# Patient Record
Sex: Male | Born: 1970 | Race: Black or African American | Hispanic: No | Marital: Single | Smoking: Former smoker
Health system: Southern US, Community
[De-identification: ages and names within clinical notes are randomized; demographics above are authoritative.]

## PROBLEM LIST (undated history)

## (undated) DIAGNOSIS — D560 Alpha thalassemia: Secondary | ICD-10-CM

## (undated) DIAGNOSIS — K069 Disorder of gingiva and edentulous alveolar ridge, unspecified: Secondary | ICD-10-CM

## (undated) DIAGNOSIS — E269 Hyperaldosteronism, unspecified: Secondary | ICD-10-CM

## (undated) DIAGNOSIS — E279 Disorder of adrenal gland, unspecified: Secondary | ICD-10-CM

## (undated) DIAGNOSIS — G4730 Sleep apnea, unspecified: Secondary | ICD-10-CM

## (undated) DIAGNOSIS — I10 Essential (primary) hypertension: Secondary | ICD-10-CM

## (undated) HISTORY — DX: Disorder of gingiva and edentulous alveolar ridge, unspecified: K06.9

## (undated) HISTORY — DX: Alpha thalassemia: D56.0

## (undated) HISTORY — DX: Essential (primary) hypertension: I10

---

## 2008-03-22 DIAGNOSIS — D560 Alpha thalassemia: Secondary | ICD-10-CM

## 2008-03-22 HISTORY — DX: Alpha thalassemia: D56.0

## 2011-06-23 ENCOUNTER — Encounter (INDEPENDENT_AMBULATORY_CARE_PROVIDER_SITE_OTHER): Payer: Self-pay | Admitting: Surgery

## 2011-06-23 ENCOUNTER — Ambulatory Visit (INDEPENDENT_AMBULATORY_CARE_PROVIDER_SITE_OTHER): Payer: BC Managed Care – PPO | Admitting: Surgery

## 2011-06-23 VITALS — BP 134/82 | HR 74 | Temp 97.8°F | Resp 18 | Ht 69.0 in | Wt >= 6400 oz

## 2011-06-23 DIAGNOSIS — E669 Obesity, unspecified: Secondary | ICD-10-CM | POA: Insufficient documentation

## 2011-06-23 DIAGNOSIS — Z6841 Body Mass Index (BMI) 40.0 and over, adult: Secondary | ICD-10-CM

## 2011-06-23 LAB — COMPREHENSIVE METABOLIC PANEL
Albumin: 4 g/dL (ref 3.5–5.2)
Alkaline Phosphatase: 65 U/L (ref 39–117)
BUN: 16 mg/dL (ref 6–23)
CO2: 25 mEq/L (ref 19–32)
Glucose, Bld: 88 mg/dL (ref 70–99)
Potassium: 4.6 mEq/L (ref 3.5–5.3)
Total Bilirubin: 0.4 mg/dL (ref 0.3–1.2)
Total Protein: 7.4 g/dL (ref 6.0–8.3)

## 2011-06-23 LAB — LIPID PANEL
Cholesterol: 123 mg/dL (ref 0–200)
Total CHOL/HDL Ratio: 3 Ratio

## 2011-06-23 LAB — T4: T4, Total: 8 ug/dL (ref 5.0–12.5)

## 2011-06-23 LAB — TSH: TSH: 2.994 u[IU]/mL (ref 0.350–4.500)

## 2011-06-23 NOTE — Progress Notes (Signed)
Addended by: Lenise Herald on: 06/23/2011 11:49 AM   Modules accepted: Orders

## 2011-06-23 NOTE — Progress Notes (Signed)
Chief Complaint:  Morbid obesity BMI 61  History of Present Illness:  Kenneth Barrett is an 41 y.o. male who works for [employer] and who is followed at [facility]  which has some problems in that he has to change doctors frequently. He has been one of our seminars and is interested in a bypass or band. I told him I would like for him to lose down to a BMI in the mid 50s before we consider a bypass. He is a big man and what I explained to him why his surgery would be more difficult. Today's weight is 4:15. He will like to go ahead and proceed with a workup and I'm going to begin that for him.  Past Medical History  Diagnosis Date  . Hypertension   . Gum disease     No past surgical history on file.  Current Outpatient Prescriptions  Medication Sig Dispense Refill  . amLODipine (NORVASC) 10 MG tablet Daily.      Marland Kitchen atenolol (TENORMIN) 50 MG tablet Daily.      . enalapril (VASOTEC) 20 MG tablet Twice daily.      . hydrochlorothiazide (HYDRODIURIL) 25 MG tablet Daily.      Marland Kitchen KLOR-CON 10 10 MEQ tablet Twice daily.      Marland Kitchen spironolactone (ALDACTONE) 25 MG tablet Twice daily.       Review of patient's allergies indicates no known allergies. Family History  Problem Relation Age of Onset  . Hypertension Mother   . Stroke Mother    Social History:   reports that he quit smoking about 10 months ago. He has never used smokeless tobacco. He reports that he drinks alcohol. He reports that he does not use illicit drugs.   REVIEW OF SYSTEMS - PERTINENT POSITIVES ONLY: negative  Physical Exam:   Blood pressure 134/82, pulse 74, temperature 97.8 F (36.6 C), temperature source Temporal, resp. rate 18, height 5\' 9"  (1.753 m), weight 415 lb (188.243 kg). Body mass index is 61.28 kg/(m^2).  Gen:  WDWN AA male NAD  Neurological: Alert and oriented to person, place, and time. Motor and sensory function is grossly intact  Head: Normocephalic and atraumatic.  Eyes: Conjunctivae are normal.  Pupils are equal, round, and reactive to light. No scleral icterus.  Neck: Normal range of motion. Neck supple. No tracheal deviation or thyromegaly present.  Cardiovascular:  SR without murmurs or gallops.  No carotid bruits Respiratory: Effort normal.  No respiratory distress. No chest wall tenderness. Breath sounds normal.  No wheezes, rales or rhonchi.  Abdomen:  Obese. No prior surgery GU: Musculoskeletal: Normal range of motion. Extremities are nontender. No cyanosis, edema or clubbing noted Lymphadenopathy: No cervical, preauricular, postauricular or axillary adenopathy is present Skin: Skin is warm and dry. No rash noted. No diaphoresis. No erythema. No pallor. Pscyh: Normal mood and affect. Behavior is normal. Judgment and thought content normal.   LABORATORY RESULTS: No results found for this or any previous visit (from the past 48 hour(s)).  RADIOLOGY RESULTS: No results found.  Problem List: There is no problem list on file for this patient.   Assessment & Plan: Morbid obesity.  Begin workup toward lapband or bypass.      Matt B. Daphine Deutscher, MD, [facility] [phone] beeper [phone]  06/23/2011 11:05 AM

## 2011-06-24 LAB — CBC
HCT: 34.2 % — ABNORMAL LOW (ref 39.0–52.0)
Hemoglobin: 10.1 g/dL — ABNORMAL LOW (ref 13.0–17.0)
MCH: 21.3 pg — ABNORMAL LOW (ref 26.0–34.0)
MCHC: 29.5 g/dL — ABNORMAL LOW (ref 30.0–36.0)
RBC: 4.74 MIL/uL (ref 4.22–5.81)

## 2011-06-29 ENCOUNTER — Ambulatory Visit (HOSPITAL_COMMUNITY)
Admission: RE | Admit: 2011-06-29 | Discharge: 2011-06-29 | Disposition: A | Discharge status: Home / Self Care | Payer: BC Managed Care – PPO | Source: Ambulatory Visit | Attending: Surgery | Admitting: Surgery

## 2011-06-29 DIAGNOSIS — K8020 Calculus of gallbladder without cholecystitis without obstruction: Secondary | ICD-10-CM | POA: Insufficient documentation

## 2011-06-29 DIAGNOSIS — E669 Obesity, unspecified: Secondary | ICD-10-CM

## 2011-07-24 ENCOUNTER — Ambulatory Visit: Payer: Self-pay | Admitting: *Deleted

## 2011-08-02 ENCOUNTER — Ambulatory Visit (HOSPITAL_COMMUNITY): Admission: RE | Admit: 2011-08-02 | Payer: BC Managed Care – PPO | Source: Ambulatory Visit | Admitting: Surgery

## 2011-08-02 ENCOUNTER — Encounter (HOSPITAL_COMMUNITY): Admission: RE | Payer: Self-pay | Source: Ambulatory Visit

## 2011-08-02 SURGERY — BREATH TEST, FOR HELICOBACTER PYLORI

## 2011-08-24 ENCOUNTER — Ambulatory Visit: Payer: Self-pay | Admitting: *Deleted

## 2011-08-27 ENCOUNTER — Encounter: Payer: Self-pay | Admitting: *Deleted

## 2011-09-07 ENCOUNTER — Ambulatory Visit (HOSPITAL_COMMUNITY): Admission: RE | Admit: 2011-09-07 | Payer: BC Managed Care – PPO | Source: Ambulatory Visit | Admitting: Surgery

## 2011-09-07 ENCOUNTER — Encounter (HOSPITAL_COMMUNITY): Admission: RE | Payer: Self-pay | Source: Ambulatory Visit

## 2011-09-07 SURGERY — BREATH TEST, FOR HELICOBACTER PYLORI

## 2011-09-21 ENCOUNTER — Ambulatory Visit (HOSPITAL_COMMUNITY): Admission: RE | Admit: 2011-09-21 | Payer: BC Managed Care – PPO | Source: Ambulatory Visit | Admitting: Surgery

## 2011-09-21 SURGERY — BREATH TEST, FOR HELICOBACTER PYLORI

## 2012-04-21 ENCOUNTER — Encounter (HOSPITAL_COMMUNITY): Payer: Self-pay | Admitting: *Deleted

## 2012-04-21 ENCOUNTER — Inpatient Hospital Stay (HOSPITAL_COMMUNITY): Payer: BC Managed Care – PPO

## 2012-04-21 ENCOUNTER — Emergency Department (HOSPITAL_COMMUNITY): Payer: BC Managed Care – PPO

## 2012-04-21 ENCOUNTER — Inpatient Hospital Stay (HOSPITAL_COMMUNITY)
Admission: EM | Admit: 2012-04-21 | Discharge: 2012-04-26 | DRG: 568 | Disposition: A | Discharge status: Home / Self Care | Payer: BC Managed Care – PPO | Attending: Emergency Medicine | Admitting: Emergency Medicine

## 2012-04-21 DIAGNOSIS — R197 Diarrhea, unspecified: Secondary | ICD-10-CM

## 2012-04-21 DIAGNOSIS — I10 Essential (primary) hypertension: Secondary | ICD-10-CM | POA: Diagnosis present

## 2012-04-21 DIAGNOSIS — J111 Influenza due to unidentified influenza virus with other respiratory manifestations: Secondary | ICD-10-CM | POA: Diagnosis present

## 2012-04-21 DIAGNOSIS — Z79899 Other long term (current) drug therapy: Secondary | ICD-10-CM

## 2012-04-21 DIAGNOSIS — E669 Obesity, unspecified: Secondary | ICD-10-CM

## 2012-04-21 DIAGNOSIS — E875 Hyperkalemia: Secondary | ICD-10-CM | POA: Diagnosis present

## 2012-04-21 DIAGNOSIS — D649 Anemia, unspecified: Secondary | ICD-10-CM | POA: Diagnosis present

## 2012-04-21 DIAGNOSIS — R579 Shock, unspecified: Secondary | ICD-10-CM | POA: Diagnosis present

## 2012-04-21 DIAGNOSIS — T465X5A Adverse effect of other antihypertensive drugs, initial encounter: Secondary | ICD-10-CM | POA: Diagnosis present

## 2012-04-21 DIAGNOSIS — E872 Acidosis, unspecified: Secondary | ICD-10-CM | POA: Diagnosis present

## 2012-04-21 DIAGNOSIS — G4733 Obstructive sleep apnea (adult) (pediatric): Secondary | ICD-10-CM | POA: Diagnosis present

## 2012-04-21 DIAGNOSIS — R578 Other shock: Secondary | ICD-10-CM | POA: Diagnosis present

## 2012-04-21 DIAGNOSIS — R112 Nausea with vomiting, unspecified: Secondary | ICD-10-CM | POA: Diagnosis present

## 2012-04-21 DIAGNOSIS — E2749 Other adrenocortical insufficiency: Secondary | ICD-10-CM | POA: Diagnosis present

## 2012-04-21 DIAGNOSIS — Z9181 History of falling: Secondary | ICD-10-CM

## 2012-04-21 DIAGNOSIS — N179 Acute kidney failure, unspecified: Principal | ICD-10-CM | POA: Diagnosis present

## 2012-04-21 DIAGNOSIS — Z87891 Personal history of nicotine dependence: Secondary | ICD-10-CM

## 2012-04-21 DIAGNOSIS — A09 Infectious gastroenteritis and colitis, unspecified: Secondary | ICD-10-CM | POA: Diagnosis present

## 2012-04-21 DIAGNOSIS — E869 Volume depletion, unspecified: Secondary | ICD-10-CM

## 2012-04-21 DIAGNOSIS — E871 Hypo-osmolality and hyponatremia: Secondary | ICD-10-CM | POA: Diagnosis present

## 2012-04-21 DIAGNOSIS — T502X5A Adverse effect of carbonic-anhydrase inhibitors, benzothiadiazides and other diuretics, initial encounter: Secondary | ICD-10-CM | POA: Diagnosis present

## 2012-04-21 DIAGNOSIS — Y92009 Unspecified place in unspecified non-institutional (private) residence as the place of occurrence of the external cause: Secondary | ICD-10-CM

## 2012-04-21 DIAGNOSIS — E269 Hyperaldosteronism, unspecified: Secondary | ICD-10-CM | POA: Diagnosis present

## 2012-04-21 HISTORY — DX: Hyperaldosteronism, unspecified: E26.9

## 2012-04-21 HISTORY — DX: Sleep apnea, unspecified: G47.30

## 2012-04-21 HISTORY — DX: Disorder of adrenal gland, unspecified: E27.9

## 2012-04-21 LAB — URINALYSIS, ROUTINE W REFLEX MICROSCOPIC
Bilirubin Urine: NEGATIVE
Glucose, UA: NEGATIVE mg/dL
Leukocytes, UA: NEGATIVE
Nitrite: NEGATIVE
Protein, ur: 100 mg/dL — AB
pH: 5 (ref 5.0–8.0)

## 2012-04-21 LAB — CBC WITH DIFFERENTIAL/PLATELET
Basophils Relative: 0 % (ref 0–1)
Eosinophils Absolute: 0 10*3/uL (ref 0.0–0.7)
MCH: 21.7 pg — ABNORMAL LOW (ref 26.0–34.0)
MCHC: 31.8 g/dL (ref 30.0–36.0)
Monocytes Absolute: 1.2 10*3/uL — ABNORMAL HIGH (ref 0.1–1.0)
Neutrophils Relative %: 73 % (ref 43–77)
Platelets: 268 10*3/uL (ref 150–400)
RDW: 19.7 % — ABNORMAL HIGH (ref 11.5–15.5)

## 2012-04-21 LAB — MAGNESIUM: Magnesium: 2.4 mg/dL (ref 1.5–2.5)

## 2012-04-21 LAB — CORTISOL: Cortisol, Plasma: 19.6 ug/dL

## 2012-04-21 LAB — BASIC METABOLIC PANEL
BUN: 62 mg/dL — ABNORMAL HIGH (ref 6–23)
BUN: 60 mg/dL — ABNORMAL HIGH (ref 6–23)
CO2: 13 mEq/L — ABNORMAL LOW (ref 19–32)
CO2: 12 mEq/L — ABNORMAL LOW (ref 19–32)
Calcium: 8.7 mg/dL (ref 8.4–10.5)
Calcium: 6.9 mg/dL — ABNORMAL LOW (ref 8.4–10.5)
Chloride: 106 mEq/L (ref 96–112)
Chloride: 107 mEq/L (ref 96–112)
Creatinine, Ser: 9.1 mg/dL — ABNORMAL HIGH (ref 0.50–1.35)
Creatinine, Ser: 7.24 mg/dL — ABNORMAL HIGH (ref 0.50–1.35)
GFR calc Af Amer: 10 mL/min — ABNORMAL LOW (ref >90)
GFR calc non Af Amer: 7 mL/min — ABNORMAL LOW (ref >90)
GFR calc non Af Amer: 8 mL/min — ABNORMAL LOW (ref >90)
Glucose, Bld: 86 mg/dL (ref 70–99)
Potassium: 6 mEq/L — ABNORMAL HIGH (ref 3.5–5.1)

## 2012-04-21 LAB — URINE MICROSCOPIC-ADD ON

## 2012-04-21 LAB — BLOOD GAS, ARTERIAL
Acid-base deficit: 14.7 mmol/L — ABNORMAL HIGH (ref 0.0–2.0)
Bicarbonate: 10.9 mEq/L — ABNORMAL LOW (ref 20.0–24.0)
O2 Saturation: 95.2 %
Patient temperature: 98.6
pO2, Arterial: 83.6 mmHg (ref 80.0–100.0)

## 2012-04-21 LAB — GLUCOSE, CAPILLARY

## 2012-04-21 LAB — MRSA PCR SCREENING: MRSA by PCR: POSITIVE — AB

## 2012-04-21 LAB — PHOSPHORUS: Phosphorus: 5.7 mg/dL — ABNORMAL HIGH (ref 2.3–4.6)

## 2012-04-21 LAB — CARBOXYHEMOGLOBIN
Carboxyhemoglobin: 1.2 % (ref 0.5–1.5)
Methemoglobin: 0.7 % (ref 0.0–1.5)

## 2012-04-21 LAB — LACTIC ACID, PLASMA: Lactic Acid, Venous: 0.8 mmol/L (ref 0.5–2.2)

## 2012-04-21 MED ORDER — SODIUM CHLORIDE 0.9 % IV SOLN
1000.0000 mL | Freq: Once | INTRAVENOUS | Status: AC
  Administered 2012-04-21: 1000 mL via INTRAVENOUS

## 2012-04-21 MED ORDER — METRONIDAZOLE IN NACL 5-0.79 MG/ML-% IV SOLN
500.0000 mg | Freq: Three times a day (TID) | INTRAVENOUS | Status: DC
  Administered 2012-04-21 – 2012-04-23 (×6): 500 mg via INTRAVENOUS
  Filled 2012-04-21 (×7): qty 100

## 2012-04-21 MED ORDER — INSULIN ASPART 100 UNIT/ML ~~LOC~~ SOLN
5.0000 [IU] | Freq: Once | SUBCUTANEOUS | Status: AC
  Administered 2012-04-21: 5 [IU] via SUBCUTANEOUS
  Filled 2012-04-21: qty 5

## 2012-04-21 MED ORDER — HEPARIN SODIUM (PORCINE) 5000 UNIT/ML IJ SOLN
5000.0000 [IU] | Freq: Three times a day (TID) | INTRAMUSCULAR | Status: DC
  Administered 2012-04-21 – 2012-04-26 (×14): 5000 [IU] via SUBCUTANEOUS
  Filled 2012-04-21 (×18): qty 1

## 2012-04-21 MED ORDER — SODIUM BICARBONATE 4.2 % IV SOLN
50.0000 meq | Freq: Once | INTRAVENOUS | Status: DC
  Filled 2012-04-21: qty 100

## 2012-04-21 MED ORDER — INSULIN REGULAR HUMAN 100 UNIT/ML IJ SOLN: 10.0000 [IU] | Freq: Once | INTRAMUSCULAR | Status: DC

## 2012-04-21 MED ORDER — SODIUM BICARBONATE 8.4 % IV SOLN: INTRAVENOUS | Status: DC

## 2012-04-21 MED ORDER — MUPIROCIN 2 % EX OINT
1.0000 "application " | TOPICAL_OINTMENT | Freq: Two times a day (BID) | CUTANEOUS | Status: AC
  Administered 2012-04-21 – 2012-04-26 (×10): 1 via NASAL
  Filled 2012-04-21: qty 22

## 2012-04-21 MED ORDER — CHLORHEXIDINE GLUCONATE CLOTH 2 % EX PADS
6.0000 | MEDICATED_PAD | Freq: Every day | CUTANEOUS | Status: DC
  Administered 2012-04-22 – 2012-04-24 (×3): 6 via TOPICAL

## 2012-04-21 MED ORDER — FAMOTIDINE IN NACL 20-0.9 MG/50ML-% IV SOLN
20.0000 mg | Freq: Two times a day (BID) | INTRAVENOUS | Status: DC
  Administered 2012-04-21 – 2012-04-24 (×7): 20 mg via INTRAVENOUS
  Filled 2012-04-21 (×10): qty 50

## 2012-04-21 MED ORDER — SODIUM CHLORIDE 0.9 % IV SOLN
INTRAVENOUS | Status: DC
  Administered 2012-04-21: 400 mL/h via INTRAVENOUS

## 2012-04-21 MED ORDER — INSULIN REGULAR HUMAN 100 UNIT/ML IJ SOLN: 5.0000 [IU] | Freq: Once | INTRAMUSCULAR | Status: DC

## 2012-04-21 MED ORDER — SODIUM CHLORIDE 0.9 % IV SOLN: 1000.0000 mL | INTRAVENOUS | Status: DC

## 2012-04-21 MED ORDER — DEXTROSE 50 % IV SOLN
1.0000 | Freq: Once | INTRAVENOUS | Status: AC
  Administered 2012-04-21: 50 mL via INTRAVENOUS
  Filled 2012-04-21: qty 50

## 2012-04-21 MED ORDER — VITAMINS A & D EX OINT
TOPICAL_OINTMENT | CUTANEOUS | Status: AC
  Administered 2012-04-21: 1
  Filled 2012-04-21: qty 5

## 2012-04-21 MED ORDER — SODIUM CHLORIDE 0.9 % IV SOLN: 250.0000 mL | INTRAVENOUS | Status: DC | PRN

## 2012-04-21 MED ORDER — SODIUM BICARBONATE 8.4 % IV SOLN
50.0000 meq | Freq: Once | INTRAVENOUS | Status: AC
  Administered 2012-04-21: 50 meq via INTRAVENOUS
  Filled 2012-04-21: qty 50

## 2012-04-21 MED ORDER — DEXTROSE 50 % IV SOLN
50.0000 mL | Freq: Once | INTRAVENOUS | Status: AC
  Administered 2012-04-21: 50 mL via INTRAVENOUS
  Filled 2012-04-21: qty 50

## 2012-04-21 MED ORDER — NOREPINEPHRINE BITARTRATE 1 MG/ML IJ SOLN
2.0000 ug/min | INTRAVENOUS | Status: DC
  Administered 2012-04-21: 5 ug/min via INTRAVENOUS
  Administered 2012-04-22 (×2): 7 ug/min via INTRAVENOUS
  Filled 2012-04-21 (×2): qty 4

## 2012-04-21 MED ORDER — SODIUM BICARBONATE 8.4 % IV SOLN
INTRAVENOUS | Status: DC
  Administered 2012-04-21 – 2012-04-23 (×5): via INTRAVENOUS
  Filled 2012-04-21 (×11): qty 150

## 2012-04-21 MED ORDER — INSULIN ASPART 100 UNIT/ML IV SOLN
10.0000 [IU] | Freq: Once | INTRAVENOUS | Status: AC
  Administered 2012-04-21: 10 [IU] via INTRAVENOUS
  Filled 2012-04-21: qty 0.1

## 2012-04-21 MED ORDER — CALCIUM GLUCONATE 10 % IV SOLN
1.0000 g | Freq: Once | INTRAVENOUS | Status: AC
  Administered 2012-04-21: 1 g via INTRAVENOUS
  Filled 2012-04-21: qty 10

## 2012-04-21 MED ORDER — KETOROLAC TROMETHAMINE 30 MG/ML IJ SOLN
30.0000 mg | Freq: Once | INTRAMUSCULAR | Status: AC
  Administered 2012-04-21: 30 mg via INTRAVENOUS
  Filled 2012-04-21: qty 1

## 2012-04-21 NOTE — ED Notes (Signed)
Dr. Effie Shy also aware of inability to obtain blood from pt as well as pt reporting hx of difficult blood draws with as many as 18 attempts, will monitor.

## 2012-04-21 NOTE — Progress Notes (Signed)
eLink Physician-Brief Progress Note Patient Name: Kenneth Barrett DOB: [birth year] MRN: [id number]  Date of Service  04/21/2012   HPI/Events of Note   Pt remains in shock state  eICU Interventions  Levophed ordered for shock state.   Intervention Category Major Interventions: Shock - evaluation and management  Shan Levans 04/21/2012, 6:18 PM

## 2012-04-21 NOTE — ED Notes (Signed)
Perineal care

## 2012-04-21 NOTE — ED Provider Notes (Signed)
Kenneth Barrett is a 42 y.o. male who has been ill for several days with diarrhea. He has nausea, but no vomiting. He is tolerating liquids. He feels weak, and dizzy. He is using his usual medications. He does not have chronic diarrhea. He is followed closely by his doctor in [place], for hyperaldosteronism. He denies recent fever, chills, cough, shortness of breath, or chest pain.  Exam- obese, moderately uncomfortable. Mouth with dry mucous membranes and lips. Abdomen soft, nontender, protuberant. Extremities, moderate peripheral edema, bilaterally. Neurologic alert, and oriented x3. Behavior and affect is appropriate. Musculoskeletal-he complains of back pain during exam.  ED course-initial blood pressure difficult to obtain due to his marked obesity- automated reading 43/27; at this time the patient is alert and lucid(07:20). Will repeat blood pressure with large, appropriate sized cuff.    09:00- left groin. Vein stick for blood, by me. 20 cc of blood obtained without complication.  Serial evaluations; patient's mental status, improved after administration of D50 and insulin, for hyperkalemia. He remained alert, and bright, and 11:45. The pressure remains low, after the fifth liter of normal saline, it is, 63/42. He continues to 2 peripheral IVs running well. IV fluids changed to 200 cc per hour, x2 lines. A Foley catheter has been placed and has recovered a scant amount of yellow urine. There was no difficulty in placing the catheter.   CRITICAL CARE Performed by: Flint Melter   Total critical care time: 50 minutes  Critical care time was exclusive of separately billable procedures and treating other patients.  Critical care was necessary to treat or prevent imminent or life-threatening deterioration.  Critical care was time spent personally by me on the following activities: development of treatment plan with patient and/or surrogate as well as nursing, discussions with  consultants, evaluation of patient's response to treatment, examination of patient, obtaining history from patient or surrogate, ordering and performing treatments and interventions, ordering and review of laboratory studies, ordering and review of radiographic studies, pulse oximetry and re-evaluation of patient's condition.   Case discussed with critical care; arrange for admission to the ICU.   Diagnoses #1 acute renal failure. #2 diarrhea. #3 hyperkalemia. #4 volume depletion   Plan: Admit to critical care, ICU status.   Flint Melter, MD 04/21/12 [phone]

## 2012-04-21 NOTE — ED Notes (Signed)
PA at bedside.

## 2012-04-21 NOTE — ED Notes (Signed)
Renal Ultrasound performed at bedside.

## 2012-04-21 NOTE — Procedures (Signed)
Central Venous Catheter Insertion Procedure Note Kenneth Barrett [id number] [birth year]  Procedure: Insertion of Central Venous Catheter Indications: Assessment of intravascular volume, Drug and/or fluid administration and Frequent blood sampling  Procedure Details Consent: Risks of procedure as well as the alternatives and risks of each were explained to the (patient/caregiver).  Consent for procedure obtained. Time Out: Verified patient identification, verified procedure, site/side was marked, verified correct patient position, special equipment/implants available, medications/allergies/relevent history reviewed, required imaging and test results available.  Performed  Maximum sterile technique was used including antiseptics, cap, gloves, gown, hand hygiene, mask and sheet. Skin prep: Chlorhexidine; local anesthetic administered A antimicrobial bonded/coated triple lumen catheter was placed Real time [place] used to ID and cannulate the vessel   in the left internal jugular vein using the Seldinger technique.  Evaluation Blood flow good Complications: No apparent complications Patient did tolerate procedure well. Chest X-ray ordered to verify placement.  CXR: pending.  BABCOCK,PETE 04/21/2012, 3:16 PM   Levy Pupa, MD, PhD 04/21/2012, 3:55 PM [facility] [phone] or if no answer [phone]

## 2012-04-21 NOTE — ED Provider Notes (Signed)
History     CSN: [id number]  Arrival date & time 04/21/12  4098   First MD Initiated Contact with Patient 04/21/12 [phone]      Chief Complaint  Patient presents with  . Influenza    (Consider location/radiation/quality/duration/timing/severity/associated sxs/prior treatment) HPI Comments: This is a 42 year old male, PMH remarkable for HTN, hyperaldosteronism, and obesity, who presents emergency department with a chief complaint of diarrhea. Patient states the symptoms began on Sunday.  He states that he has been drinking lots of fluid, but has been having a lot of diarrhea.  He has not tried taking anything to alleviate his symptoms.  He denies blood in stool.  He denies chest pain, SOB, and vomiting.  He has had some nausea.  He has not had recent antibiotic use.  He has been taking his medications as prescribed.  Additionally, he complains of back pain, that is worsened with movement.  The history is provided by the patient. No language interpreter was used.    Past Medical History  Diagnosis Date  . Hypertension   . Gum disease   . Hyperaldosteronism   . Sleep apnea   . Adrenal gland disorder     History reviewed. No pertinent past surgical history.  Family History  Problem Relation Age of Onset  . Hypertension Mother   . Stroke Mother     History  Substance Use Topics  . Smoking status: Former Smoker    Quit date: 08/05/2010  . Smokeless tobacco: Never Used  . Alcohol Use: Yes     Comment: 3 beers per month      Review of Systems  All other systems reviewed and are negative.    Allergies  Review of patient's allergies indicates no known allergies.  Home Medications   Current Outpatient Rx  Name  Route  Sig  Dispense  Refill  . AMLODIPINE BESYLATE 10 MG PO TABS   Oral   Take 10 mg by mouth Daily.          . ATENOLOL 50 MG PO TABS   Oral   Take 50 mg by mouth Daily.          . ENALAPRIL MALEATE 20 MG PO TABS   Oral   Take 20 mg by mouth 2  (two) times daily.          Marland Kitchen FLUOXETINE HCL 10 MG PO CAPS   Oral   Take 10 mg by mouth daily.         Marland Kitchen HYDROCHLOROTHIAZIDE 25 MG PO TABS      Daily.         Marland Kitchen POTASSIUM CHLORIDE ER 10 MEQ PO TBCR   Oral   Take 20 mEq by mouth daily.         Marland Kitchen SPIRONOLACTONE 50 MG PO TABS   Oral   Take 50 mg by mouth 2 (two) times daily.           Pulse 77  Temp 97.6 F (36.4 C) (Oral)  Resp 18  SpO2 95%  Physical Exam  Nursing note and vitals reviewed. Constitutional: He is oriented to person, place, and time. He appears well-developed and well-nourished.       Patient responds appropriately, he is alert and oriented  HENT:  Head: Normocephalic and atraumatic.  Right Ear: External ear normal.  Left Ear: External ear normal.  Nose: Nose normal.  Mouth/Throat: Oropharynx is clear and moist. No oropharyngeal exudate.       Dry mucous membranes,  no signs of tonsillar or peritonsillar abscesses, oropharynx is clear and without exudate  Eyes: Conjunctivae normal and EOM are normal. Pupils are equal, round, and reactive to light. Right eye exhibits no discharge. Left eye exhibits no discharge. No scleral icterus.  Neck: Normal range of motion. Neck supple. No JVD present.  Cardiovascular: Normal rate, regular rhythm, normal heart sounds and intact distal pulses.  Exam reveals no gallop and no friction rub.   No murmur heard. Pulmonary/Chest: Effort normal and breath sounds normal. No respiratory distress. He has no wheezes. He has no rales. He exhibits no tenderness.  Abdominal: Soft. Bowel sounds are normal. He exhibits no distension and no mass. There is no tenderness. There is no rebound and no guarding.  Musculoskeletal: Normal range of motion. He exhibits no edema and no tenderness.  Neurological: He is alert and oriented to person, place, and time. He has normal reflexes.       CN 3-12 intact  Skin: Skin is warm and dry.  Psychiatric: He has a normal mood and affect. His  behavior is normal. Judgment and thought content normal.    ED Course  Procedures (including critical care time)  Labs Reviewed - No data to display No results found.  ED ECG REPORT  I personally interpreted this EKG   Date: 04/21/2012   Rate: 77  Rhythm: normal sinus rhythm  QRS Axis: normal  Intervals: normal  ST/T Wave abnormalities: normal  Conduction Disutrbances:left bundle branch block  Narrative Interpretation:   Old EKG Reviewed: unchanged    1. Acute renal failure   2. Diarrhea   3. Volume depletion     ED course:  Blood pressure 97/53  MDM  42 year old male with acute renal failure, hyperkalemia, diarrhea, hypotension, and dehydration. Patient has received 4 L of normal saline, he has 2 peripheral IVs, pain has been controlled with Toradol.  This patient has been seen by and discussed with Dr. Effie Shy, who is helping me manage care at this time. Will obtain blood cultures via femoral stick. Will give insulin, D50, and bicarbonate to reduce potassium level.  10:10 AM Obtain blood cultures via femoral stick with Dr. Effie Shy. Discussed patient with Dr. Effie Shy, who tells me to have the patient admitted for acute renal failure, hyperkalemia, diarrhea, and hypertension. Will consult the hospitalist team.  12:26 PM I consult to the hospitalist team, who were reluctant to take the patient do to his severe hypotension. Discussed the patient with Dr. Effie Shy, who will consult critical care. Patient has had several reassessments and nearly 5 L of fluid given in the ED.     Roxy Horseman, PA-C 04/21/12 1227

## 2012-04-21 NOTE — ED Notes (Addendum)
Chest x-ray performed at bedside to ensure correct central line placement.

## 2012-04-21 NOTE — ED Notes (Signed)
CCM PA and MD at bedside for central line insertion.

## 2012-04-21 NOTE — ED Notes (Signed)
Dr. Micah Flesher

## 2012-04-21 NOTE — ED Notes (Signed)
Effie Shy, MD at bedside, also aware of BP issues.

## 2012-04-21 NOTE — ED Notes (Signed)
Per EMS report: flu-like symptoms since Sunday.  Pt reports N/D, fever and chills. BP: 126/96, HR:90, RR: 20

## 2012-04-21 NOTE — ED Notes (Signed)
Pt reports that he has not voided in 2 days.

## 2012-04-21 NOTE — ED Notes (Signed)
Dr. Effie Shy aware that pt has received 3000 NS IV bolus, ordered another 2000 which are infusing at present, will monitor.

## 2012-04-21 NOTE — ED Notes (Signed)
Dr. Effie Shy to bedside- performed femoral stick for labs. Pt tolerated well without any complications at this time.  Site is not bleeding.

## 2012-04-21 NOTE — H&P (Addendum)
PULMONARY  / CRITICAL CARE MEDICINE  Name: Kenneth Barrett MRN: [id number] DOB: 10/11/1970    ADMISSION DATE:  04/21/2012 CONSULTATION DATE:  1/31  REFERRING MD :  Kenneth Barrett  CHIEF COMPLAINT:  Diarrhea  BRIEF PATIENT DESCRIPTION:  42yo male with a history of hyperaldosteronism complaining of flu like symptoms since 04/16/12. 1/28 he developed diarrhea and dizziness, which caused him to fall twice, no urine output since 1/29 and describes back/flank pain during this period.  Presents to ED 1/31 w/ c/o weakness. Found to be hypotensive which did not respond to 5 liters IVF resuscitation and in acute renal failure w/ metabolic acidosis.   SIGNIFICANT EVENTS / STUDIES:  Renal US 1/31>>>  LINES / TUBES:   CULTURES: BC 1/31>>> Stool 1/31>>> C-diff 1/31>>> O&p 1/31>>>  ANTIBIOTICS: FLAGYL 1/31>>>  HISTORY OF PRESENT ILLNESS:  Kenneth Barrett was in his normal state of health until 1/24 when he began having numbness and pain in bilateral legs that would resolve with position change. 04/16/12 he describes the onset of flu-like symptoms including runny nose, cough, fatigue, and generalized body aches. He cannot describe any potential exposures. 1/28 he developed persistent diarrhea. He was having one liquid bowel movement at least every 2 hours. Denies any recent use of antibiotics, hematochezia, and melena. At that time he also began having episodes of dizziness described as the room spinning and the feeling that his legs would give out. Two such episodes caused him to become syncopal and fall from standing. He denies any trauma related to the falls. He has not voided since Wednesday night. Prior to this anuria he describes his urine and urinary patterns as normal. Back/flank pain has been intermittent, radiates around to his abdomen. 1/30 he had one brief episode of SOB where he was gasping to try to catch his breath, which resolved spontaneously. He presents to the ED today with chief complaint of persistent  diarrhea. He has been taking prescribed antihypertensives and diuretics as prescribed until today, he has not taken anything.   PAST MEDICAL HISTORY :  Past Medical History  Diagnosis Date  . Hypertension   . Gum disease   . Hyperaldosteronism   . Sleep apnea   . Adrenal gland disorder    History reviewed. No pertinent past surgical history. Prior to Admission medications   Medication Sig Start Date End Date Taking? Authorizing Provider  amLODipine (NORVASC) 10 MG tablet Take 10 mg by mouth Daily.  06/02/11  Yes Historical Provider, MD  atenolol (TENORMIN) 50 MG tablet Take 50 mg by mouth Daily.  05/31/11  Yes Historical Provider, MD  enalapril (VASOTEC) 20 MG tablet Take 20 mg by mouth 2 (two) times daily.  05/04/11  Yes Historical Provider, MD  FLUoxetine (PROZAC) 10 MG capsule Take 10 mg by mouth daily.   Yes Historical Provider, MD  hydrochlorothiazide (HYDRODIURIL) 25 MG tablet Daily. 06/02/11  Yes Historical Provider, MD  potassium chloride (K-DUR) 10 MEQ tablet Take 20 mEq by mouth daily.   Yes Historical Provider, MD  spironolactone (ALDACTONE) 50 MG tablet Take 50 mg by mouth 2 (two) times daily.   Yes Historical Provider, MD   No Known Allergies  FAMILY HISTORY:  Family History  Problem Relation Age of Onset  . Hypertension Mother   . Stroke Mother    SOCIAL HISTORY:  reports that he quit smoking about 20 months ago. He has never used smokeless tobacco. He reports that he drinks alcohol. He reports that he does not use illicit drugs.  REVIEW OF SYSTEMS:  See HPI Neuro: + dizziness, 2 falls since 1/28 denies headache, visual changes Cardiovascular: Denies chest pain, palpitations Respiratory: One episode SOB GI: persistent diarrhea x4 days. No hematochezia, melena. No abdominal pain. GU: No urine output for 2 days. Denies dysuria, hematuria.  MSK: Back/flank pain, radiating to abdomen. Denies edema.      VITAL SIGNS: Temp:  [97.6 F (36.4 C)-99.4 F (37.4 C)] 99.4  F (37.4 C) (01/31 0755) Pulse Rate:  [77] 77  (01/31 0636) Resp:  [18-29] 24  (01/31 1100) BP: (60-94)/(22-67) 85/44 mmHg (01/31 1100) SpO2:  [95 %] 95 % (01/31 0636) HEMODYNAMICS:   VENTILATOR SETTINGS:   INTAKE / OUTPUT: Intake/Output      01/30 0701 - 01/31 0700 01/31 0701 - 02/01 0700   I.V.  7000   Total Intake  7000   Total Output  0   Net  +7000          PHYSICAL EXAMINATION: General:  Acutely ill appearing male.  Neuro:  AAOx4 HEENT:  Normocephalic, PERRL Cardiovascular:  S1, S2 distant, intact. No gallops, rubs, murmurs.  Lungs:  Mild end inspiratory wheeze in left lung fields, right is CTA Abdomen:  BS normo-hyperactive x 4 quadrants. Soft, non-distended, generalized tenderness.  Musculoskeletal:  ROM wnl x4 extremities.  Skin:  Warm, dry, intact.   LABS:  Lab 04/21/12 1318 04/21/12 0900 04/21/12 0703  HGB -- -- 10.7*  WBC -- -- 10.6*  PLT -- -- 268  NA 133* -- 127*  K 6.0* -- 6.0*  CL 106 -- 94*  CO2 12* -- 13*  GLUCOSE 86 -- 97  BUN 62* -- 63*  CREATININE 8.52* -- 9.10*  CALCIUM 7.6* -- 8.7  MG -- -- --  PHOS -- -- --  AST -- -- --  ALT -- -- --  ALKPHOS -- -- --  BILITOT -- -- --  PROT -- -- --  ALBUMIN -- -- --  APTT -- -- --  INR -- -- --  LATICACIDVEN 1.5 0.8 --  TROPONINI -- -- --  PROCALCITON -- -- --  PROBNP -- -- --  O2SATVEN -- -- --  PHART -- -- --  PCO2ART -- -- --  PO2ART -- -- --    Lab 04/21/12 0750  GLUCAP 86    CXR: 1/31 No active disease  ASSESSMENT / PLAN:  PULMONARY A: No Acute Issue, H/O osa P:   Supportive Care  CARDIOVASCULAR A: Hypovolemic Shock Remains hypotensive after what should be adequate volume resuscitation efforts. Additional complicating factors could include acidosis or adrenal insuff. Septic shock on diff dx but does not appear toxic nor does he meet SIRS criteria.  P:  - Insert central line - Aggressive fluid resuscitation - Check lactate -correct acidosis -low threshold for stress  dose steroids.  -hold all antihypertensives  RENAL A:   Acute Renal failure Metabolic acidosis (both Non-gap/gap process) Hyperkalemia, Hyponatremia Likely multi-factorial, hypovolemia and antihypertensives: including Ace-I and diuretics. Worried also as he received toradol prior to our arrival for analgesia.  P:   - Aggressive IV fluid resuscitation  - Renal US to evaluate for intrarenal kidney injury.  - Bicarb gtt -close obs of chemistry   GASTROINTESTINAL A:  Diarrhea P:   - See Infectious Sxn  HEMATOLOGIC A:  Anemia (chronic), anticipate hgb drop w/ next lab test d/t hemodilution.  P:  Supportive Care  INFECTIOUS A:  Potential infectious cause for diarrhea. ? C-diff vs other  ? Septic shock, patient  does not meet SIRS criteria.  ? E coli O-157:H7 infection, though not likely as patient denies hematochezia. P:   F/u stool cultures and O&P Empiric PO Flagyl  ENDOCRINE A:  Hyperaldosteronism  P:   - Hold antihypertensives and diuretics - Check cortisol NEUROLOGIC A:  No acute issue P:  Supportive care.  TODAY'S SUMMARY: 1 YOM presents to ER in shock, metabolic acidosis, and renal failure after several days of diarrhea. Has not responded to IVFs, May be due to acidosis also consider sepsis. Will admit to provide IVFs, place on bicarb gtt, r/o infectious diarrhea and in mean time add empiric enteral flagyl. He will need central access to better assess volume status. If renal fxn worsens may need early nephrology involvement.   I have personally obtained a history, examined the patient, evaluated laboratory and imaging results, formulated the assessment and plan and placed orders. CRITICAL CARE: The patient is critically ill with multiple organ systems failure and requires high complexity decision making for assessment and support, frequent evaluation and titration of therapies, application of advanced monitoring technologies and extensive interpretation of multiple  databases. Critical Care Time devoted to patient care services described in this note is 90 minutes.    Levy Pupa, MD, PhD 04/21/2012, 2:41 PM [facility] [phone] or if no answer [phone]

## 2012-04-21 NOTE — ED Notes (Signed)
RUE:[id number]<JW> Expected date:<BR> Expected time:<BR> Means of arrival:<BR> Comments:<BR> EMS/flu like symptoms

## 2012-04-21 NOTE — ED Notes (Signed)
Minimal urine return, not enough for an UA will attempt to obtain specimen after fluid bolus

## 2012-04-21 NOTE — ED Notes (Addendum)
Left jugular central line placed by Anders Simmonds, NP at bedside. Pt tolerated well.

## 2012-04-21 NOTE — ED Notes (Signed)
PA at bedside, aware of inability to obtain BP, pt remains alert and oriented but ashen in color. BP attempted manually and automatically on arms and legs.

## 2012-04-21 NOTE — ED Notes (Signed)
Patient has no urinary output, Foley catheter is in place.

## 2012-04-22 ENCOUNTER — Inpatient Hospital Stay (HOSPITAL_COMMUNITY): Payer: BC Managed Care – PPO

## 2012-04-22 LAB — BLOOD GAS, ARTERIAL
Acid-base deficit: 9.2 mmol/L — ABNORMAL HIGH (ref 0.0–2.0)
Drawn by: 308601
FIO2: 0.21 %
O2 Saturation: 98 %
TCO2: 13.1 mmol/L (ref 0–100)

## 2012-04-22 LAB — CBC
HCT: 27.3 % — ABNORMAL LOW (ref 39.0–52.0)
HCT: 25.1 % — ABNORMAL LOW (ref 39.0–52.0)
Hemoglobin: 8.1 g/dL — ABNORMAL LOW (ref 13.0–17.0)
MCH: 21.6 pg — ABNORMAL LOW (ref 26.0–34.0)
MCHC: 32.3 g/dL (ref 30.0–36.0)
MCV: 67.2 fL — ABNORMAL LOW (ref 78.0–100.0)
MCV: 66.9 fL — ABNORMAL LOW (ref 78.0–100.0)
Platelets: 209 10*3/uL (ref 150–400)
RBC: 4.06 MIL/uL — ABNORMAL LOW (ref 4.22–5.81)
RDW: 19.4 % — ABNORMAL HIGH (ref 11.5–15.5)
RDW: 19.2 % — ABNORMAL HIGH (ref 11.5–15.5)
WBC: 8.5 10*3/uL (ref 4.0–10.5)

## 2012-04-22 LAB — BASIC METABOLIC PANEL
BUN: 44 mg/dL — ABNORMAL HIGH (ref 6–23)
BUN: 37 mg/dL — ABNORMAL HIGH (ref 6–23)
CO2: 22 mEq/L (ref 19–32)
CO2: 23 mEq/L (ref 19–32)
Calcium: 7.4 mg/dL — ABNORMAL LOW (ref 8.4–10.5)
Calcium: 7.6 mg/dL — ABNORMAL LOW (ref 8.4–10.5)
Chloride: 102 mEq/L (ref 96–112)
Creatinine, Ser: 5.1 mg/dL — ABNORMAL HIGH (ref 0.50–1.35)
Creatinine, Ser: 3.72 mg/dL — ABNORMAL HIGH (ref 0.50–1.35)
GFR calc Af Amer: 15 mL/min — ABNORMAL LOW (ref >90)
GFR calc Af Amer: 29 mL/min — ABNORMAL LOW (ref >90)
GFR calc non Af Amer: 13 mL/min — ABNORMAL LOW (ref >90)
Glucose, Bld: 140 mg/dL — ABNORMAL HIGH (ref 70–99)
Glucose, Bld: 111 mg/dL — ABNORMAL HIGH (ref 70–99)
Sodium: 134 mEq/L — ABNORMAL LOW (ref 135–145)

## 2012-04-22 LAB — INFLUENZA PANEL BY PCR (TYPE A & B)
H1N1 flu by pcr: DETECTED — AB
Influenza A By PCR: POSITIVE — AB
Influenza B By PCR: NEGATIVE

## 2012-04-22 LAB — GLUCOSE, CAPILLARY
Glucose-Capillary: 111 mg/dL — ABNORMAL HIGH (ref 70–99)
Glucose-Capillary: 113 mg/dL — ABNORMAL HIGH (ref 70–99)
Glucose-Capillary: 119 mg/dL — ABNORMAL HIGH (ref 70–99)
Glucose-Capillary: 127 mg/dL — ABNORMAL HIGH (ref 70–99)
Glucose-Capillary: 136 mg/dL — ABNORMAL HIGH (ref 70–99)
Glucose-Capillary: 143 mg/dL — ABNORMAL HIGH (ref 70–99)

## 2012-04-22 LAB — COMPREHENSIVE METABOLIC PANEL
AST: 21 U/L (ref 0–37)
Albumin: 2.7 g/dL — ABNORMAL LOW (ref 3.5–5.2)
Alkaline Phosphatase: 45 U/L (ref 39–117)
CO2: 16 mEq/L — ABNORMAL LOW (ref 19–32)
Chloride: 104 mEq/L (ref 96–112)
GFR calc non Af Amer: 12 mL/min — ABNORMAL LOW (ref >90)
Potassium: 4.5 mEq/L (ref 3.5–5.1)
Total Bilirubin: 0.2 mg/dL — ABNORMAL LOW (ref 0.3–1.2)

## 2012-04-22 MED ORDER — ALUM & MAG HYDROXIDE-SIMETH 200-200-20 MG/5ML PO SUSP
30.0000 mL | Freq: Four times a day (QID) | ORAL | Status: DC | PRN
  Administered 2012-04-22 – 2012-04-24 (×4): 30 mL via ORAL
  Filled 2012-04-22 (×4): qty 30

## 2012-04-22 MED ORDER — OSELTAMIVIR PHOSPHATE 75 MG PO CAPS
75.0000 mg | ORAL_CAPSULE | Freq: Two times a day (BID) | ORAL | Status: DC
  Administered 2012-04-22 – 2012-04-26 (×9): 75 mg via ORAL
  Filled 2012-04-22 (×10): qty 1

## 2012-04-22 NOTE — Progress Notes (Signed)
Pt placed on Nasal CPAP at 8 CMH20 Per Pt home settings, pt tolerating well at this time, RT to monitor and assess as needed.

## 2012-04-22 NOTE — Progress Notes (Signed)
Pt placed on nasal CPAP at 8 CMH20 per home settings, Pt tolerating well at this time, RT to monitor and assess as needed.

## 2012-04-22 NOTE — Procedures (Signed)
Arterial Catheter Insertion Procedure Note KACHE MCCLURG [id number] 02/15/1971  Procedure: Insertion of Arterial Catheter  Indications: Blood pressure monitoring  Procedure Details Consent: Risks of procedure as well as the alternatives and risks of each were explained to the (patient/caregiver).  Consent for procedure obtained. Time Out: Verified patient identification, verified procedure, site/side was marked, verified correct patient position, special equipment/implants available, medications/allergies/relevent history reviewed, required imaging and test results available.  Performed  Maximum sterile technique was used including cap, gloves, gown, hand hygiene, mask and sheet. Skin prep: Chlorhexidine; local anesthetic administered 20 gauge catheter was inserted into right radial artery using the Seldinger technique.  Evaluation Blood flow good; BP tracing good. Complications: No apparent complications.   Berton Bon 04/22/2012

## 2012-04-22 NOTE — Progress Notes (Signed)
Utilization review completed.  

## 2012-04-22 NOTE — Progress Notes (Signed)
PULMONARY  / CRITICAL CARE MEDICINE  Name: Kenneth Barrett MRN: [id number] DOB: 1970-04-27    ADMISSION DATE:  04/21/2012 CONSULTATION DATE:  1/31  REFERRING MD :  Effie Shy  CHIEF COMPLAINT:  Diarrhea  BRIEF PATIENT DESCRIPTION:  42yo male with a history of hyperaldosteronism complaining of flu like symptoms since 04/16/12. 1/28 he developed diarrhea and dizziness, which caused him to fall twice, no urine output since 1/29 and describes back/flank pain during this period.  Presents to ED 1/31 w/ c/o weakness. Found to be hypotensive which did not respond to 5 liters IVF resuscitation and in acute renal failure w/ metabolic acidosis.  Home meds include enalapril & aldactone  SIGNIFICANT EVENTS / STUDIES:  Renal US 1/31>>>  LINES / TUBES:   CULTURES: BC 1/31>>> Stool 1/31>>> C-diff 1/31>>> O&p 1/31>>>  ANTIBIOTICS: FLAGYL 1/31>>>  SUBJ  - denies pain, slept well on cpap Diarrhea stopped  VITAL SIGNS: Temp:  [97.6 F (36.4 C)-99.4 F (37.4 C)] 99.2 F (37.3 C) (02/01 0800) Pulse Rate:  [55-85] 76  (02/01 0800) Resp:  [18-29] 22  (02/01 0800) BP: (68-117)/(21-55) 104/52 mmHg (02/01 0145) SpO2:  [99 %-100 %] 100 % (02/01 0800) Weight:  [188 kg (414 lb 7.4 oz)-188.4 kg (415 lb 5.6 oz)] 188 kg (414 lb 7.4 oz) (02/01 0500) HEMODYNAMICS: CVP:  [9 mmHg-13 mmHg] 13 mmHg VENTILATOR SETTINGS:   INTAKE / OUTPUT: Intake/Output      01/31 0701 - 02/01 0700 02/01 0701 - 02/02 0700   P.O. 600    I.V. (mL/kg) 9736.2 (51.8) 188.8 (1)   IV Piggyback 500    Total Intake(mL/kg) 10836.2 (57.6) 188.8 (1)   Urine (mL/kg/hr) 2170 (0.5) 250   Total Output 2170 250   Net +8666.2 -61.2        Stool Occurrence       PHYSICAL EXAMINATION: General:  Acutely ill appearing male.  Neuro:  AAOx4 HEENT:  Normocephalic, PERRL Cardiovascular:  S1, S2 distant, intact. No gallops, rubs, murmurs.  Lungs:  Mild end inspiratory wheeze in left lung fields, right is CTA Abdomen:  BS normo-hyperactive x  4 quadrants. Soft, non-distended, generalized tenderness.  Musculoskeletal:  ROM wnl x4 extremities.  Skin:  Warm, dry, intact.   LABS:  Lab 04/22/12 4098 04/22/12 0215 04/22/12 0205 04/22/12 0015 04/21/12 2009 04/21/12 1745 04/21/12 1413 04/21/12 1412 04/21/12 1318 04/21/12 0900 04/21/12 0703  HGB 8.1* -- -- 8.9* -- -- -- -- -- -- 10.7*  WBC 8.6 -- -- 8.5 -- -- -- -- -- -- 10.6*  PLT 200 -- -- 209 -- -- -- -- -- -- 268  NA -- 134* -- 132* -- 134* -- -- -- -- --  K -- 4.5 -- 4.5 -- -- -- -- -- -- --  CL -- 103 -- 104 -- 107 -- -- -- -- --  CO2 -- 15* -- 16* -- 14* -- -- -- -- --  GLUCOSE -- 125* -- 112* -- 133* -- -- -- -- --  BUN -- 53* -- 53* -- 60* -- -- -- -- --  CREATININE -- 5.10* -- 5.43* -- 7.24* -- -- -- -- --  CALCIUM -- 7.4* -- 7.3* -- 6.9* -- -- -- -- --  MG 2.0 -- -- 2.2 -- -- -- -- 2.4 -- --  PHOS 3.6 -- -- -- -- -- -- -- 5.7* -- --  AST -- -- -- 21 -- -- -- -- -- -- --  ALT -- -- -- 17 -- -- -- -- -- -- --  ALKPHOS -- -- -- 45 -- -- -- -- -- -- --  BILITOT -- -- -- 0.2* -- -- -- -- -- -- --  PROT -- -- -- 6.3 -- -- -- -- -- -- --  ALBUMIN -- -- -- 2.7* -- -- -- -- -- -- --  APTT -- -- -- -- -- -- -- -- -- -- --  INR -- -- -- -- -- -- -- -- -- -- --  LATICACIDVEN -- -- -- -- -- -- -- -- 1.5 0.8 --  TROPONINI -- <0.30 -- -- <0.30 -- -- -- <0.30 -- --  PROCALCITON -- -- -- -- -- -- -- 0.24 -- -- --  PROBNP -- -- -- -- -- -- -- -- -- -- --  O2SATVEN -- -- -- -- -- -- -- -- -- -- --  PHART -- -- 7.401 -- -- -- 7.259* -- -- -- --  PCO2ART -- -- 23.0* -- -- -- 25.2* -- -- -- --  PO2ART -- -- 85.8 -- -- -- 83.6 -- -- -- --    Lab 04/22/12 0422 04/22/12 0008 04/21/12 1940 04/21/12 0750  GLUCAP 143* 113* 118* 86    CXR: 1/31 No active disease  ASSESSMENT / PLAN:  PULMONARY A: No Acute Issue, H/O osa P:   CPAP during sleep  CARDIOVASCULAR A: Hypovolemic Shock Remains hypotensive after what should be adequate volume resuscitation efforts. Additional  complicating factors could include acidosis or adrenal insuff. Septic shock on diff dx but does not appear toxic nor does he meet SIRS criteria.  P:  - Aggressive fluid resuscitation, goal CVP achieved -Low co-ox but hold off dobutamine ( since likely left anamolousSVC ) -low threshold for stress dose steroids if remains on pressors  -hold all antihypertensives  RENAL A:   Acute Renal failure Metabolic acidosis (both Non-gap/gap process) Hyperkalemia, Hyponatremia -resolved Likely multi-factorial, hypovolemia and antihypertensives: including Ace-I and diuretics. also received toradol  - Renal US -no hnosis P:   - Aggressive IV fluid resuscitation  - decrease Bicarb gtt to 125/h -bmet q 8  GASTROINTESTINAL A:  Diarrhea - rsolved P:   - isolation  HEMATOLOGIC A:  Anemia (chronic), anticipate hgb drop w/ next lab test d/t hemodilution.  P:  Supportive Care  INFECTIOUS A:  Potential infectious cause for diarrhea. ? C-diff vs other   Septic shock ? E coli O-157:H7 infection, though not likely as patient denies hematochezia. P:   F/u stool cultures and O&P Empiric PO Flagyl  ENDOCRINE A:  Hyperaldosteronism   cortisol- 20 P:   - Hold antihypertensives and diuretics  NEUROLOGIC A:  No acute issue P:  Supportive care.  TODAY'S SUMMARY: SHock, ARf- resolving  I have personally obtained a history, examined the patient, evaluated laboratory and imaging results, formulated the assessment and plan and placed orders.  CRITICAL CARE: The patient is critically ill with multiple organ systems failure and requires high complexity decision making for assessment and support, frequent evaluation and titration of therapies, application of advanced monitoring technologies and extensive interpretation of multiple databases. Critical Care Time devoted to patient care services described in this note is 32 minutes.    Cyril Mourning MD. Tonny Bollman. [facility] Pager [phone] If no response call [phone]   04/22/2012, 8:21 AM

## 2012-04-22 NOTE — Progress Notes (Signed)
eLink Physician-Brief Progress Note Patient Name: Kenneth Barrett DOB: [birth year] MRN: [id number]  Date of Service  04/22/2012   HPI/Events of Note     eICU Interventions  maalox prn   Intervention Category Minor Interventions: Routine modifications to care plan (e.g. PRN medications for pain, fever)  ALVA,RAKESH V. 04/22/2012, 6:11 PM

## 2012-04-23 DIAGNOSIS — J111 Influenza due to unidentified influenza virus with other respiratory manifestations: Secondary | ICD-10-CM

## 2012-04-23 LAB — BASIC METABOLIC PANEL
Calcium: 7.5 mg/dL — ABNORMAL LOW (ref 8.4–10.5)
Creatinine, Ser: 2.34 mg/dL — ABNORMAL HIGH (ref 0.50–1.35)
GFR calc Af Amer: 48 mL/min — ABNORMAL LOW (ref >90)
GFR calc non Af Amer: 33 mL/min — ABNORMAL LOW (ref >90)
GFR calc non Af Amer: 41 mL/min — ABNORMAL LOW (ref >90)
Glucose, Bld: 109 mg/dL — ABNORMAL HIGH (ref 70–99)
Glucose, Bld: 96 mg/dL (ref 70–99)
Potassium: 3.7 mEq/L (ref 3.5–5.1)
Sodium: 135 mEq/L (ref 135–145)
Sodium: 135 mEq/L (ref 135–145)

## 2012-04-23 LAB — GLUCOSE, CAPILLARY: Glucose-Capillary: 114 mg/dL — ABNORMAL HIGH (ref 70–99)

## 2012-04-23 LAB — CBC
MCH: 21.8 pg — ABNORMAL LOW (ref 26.0–34.0)
Platelets: 156 10*3/uL (ref 150–400)
RBC: 3.72 MIL/uL — ABNORMAL LOW (ref 4.22–5.81)
RDW: 18.6 % — ABNORMAL HIGH (ref 11.5–15.5)

## 2012-04-23 LAB — URINE CULTURE: Colony Count: NO GROWTH

## 2012-04-23 MED ORDER — SODIUM CHLORIDE 0.9 % IV SOLN
INTRAVENOUS | Status: DC
  Administered 2012-04-23 – 2012-04-25 (×3): via INTRAVENOUS

## 2012-04-23 MED ORDER — GI COCKTAIL ~~LOC~~
30.0000 mL | Freq: Once | ORAL | Status: AC
  Administered 2012-04-23: 30 mL via ORAL
  Filled 2012-04-23: qty 30

## 2012-04-23 NOTE — Progress Notes (Signed)
PULMONARY  / CRITICAL CARE MEDICINE  Name: Kenneth Barrett MRN: [id number] DOB: 1970-11-02    ADMISSION DATE:  04/21/2012 CONSULTATION DATE:  1/31  REFERRING MD :  Effie Shy  CHIEF COMPLAINT:  Diarrhea  BRIEF PATIENT DESCRIPTION:  42yo male with a history of hyperaldosteronism complaining of flu like symptoms since 04/16/12. 1/28 he developed diarrhea and dizziness, which caused him to fall twice, no urine output since 1/29 and describes back/flank pain during this period.  Presents to ED 1/31 w/ c/o weakness. Found to be hypotensive which did not respond to 5 liters IVF resuscitation and in acute renal failure w/ metabolic acidosis.  Home meds include enalapril & aldactone  SIGNIFICANT EVENTS / STUDIES:  Renal US 1/31>>>no hnosis  LINES / TUBES:   CULTURES: BC 1/31>>>ng Stool 1/31>>> C-diff 1/31>>>neg O&p 1/31>>>  ANTIBIOTICS: FLAGYL 1/31>>>2/2  SUBJ  - c/o abd pain overnight -improved with GI cocktail, slept well on cpap 3 loose stools overnight  VITAL SIGNS: Temp:  [98.8 F (37.1 C)-99.7 F (37.6 C)] 98.8 F (37.1 C) (02/02 0800) Pulse Rate:  [62-146] 92  (02/02 0800) Resp:  [16-31] 26  (02/02 0800) BP: (101-102)/(38-52) 101/52 mmHg (02/02 0800) SpO2:  [92 %-99 %] 95 % (02/02 0800) HEMODYNAMICS: CVP:  [12 mmHg-18 mmHg] 16 mmHg VENTILATOR SETTINGS:   INTAKE / OUTPUT: Intake/Output      02/01 0701 - 02/02 0700 02/02 0701 - 02/03 0700   P.O. 560    I.V. (mL/kg) 3484.2 (18.5) 145 (0.8)   Other 80    IV Piggyback 400    Total Intake(mL/kg) 4524.2 (24.1) 145 (0.8)   Urine (mL/kg/hr) 2450 (0.5) 150   Total Output 2450 150   Net +2074.2 -5        Stool Occurrence 4 x      PHYSICAL EXAMINATION: General:  Acutely ill appearing male.  Neuro:  AAOx4 HEENT:  Normocephalic, PERRL Cardiovascular:  S1, S2 distant, intact. No gallops, rubs, murmurs.  Lungs:  Decreased BS BL Abdomen:  BS normo-hyperactive x 4 quadrants. Soft, non-distended, generalized tenderness.   Musculoskeletal:  ROM wnl x4 extremities.  Skin:  Warm, dry, intact.   LABS:  Lab 04/23/12 0319 04/22/12 1800 04/22/12 1000 04/22/12 0614 04/22/12 0215 04/22/12 0205 04/22/12 0015 04/21/12 2009 04/21/12 1413 04/21/12 1412 04/21/12 1318 04/21/12 0900  HGB 8.1* -- -- 8.1* -- -- 8.9* -- -- -- -- --  WBC 6.4 -- -- 8.6 -- -- 8.5 -- -- -- -- --  PLT 156 -- -- 200 -- -- 209 -- -- -- -- --  NA 135 136 134* -- -- -- -- -- -- -- -- --  K 4.3 4.0 -- -- -- -- -- -- -- -- -- --  CL 101 102 102 -- -- -- -- -- -- -- -- --  CO2 26 23 22  -- -- -- -- -- -- -- -- --  GLUCOSE 109* 111* 140* -- -- -- -- -- -- -- -- --  BUN 30* 37* 44* -- -- -- -- -- -- -- -- --  CREATININE 2.34* 2.91* 3.72* -- -- -- -- -- -- -- -- --  CALCIUM 7.5* 7.4* 7.6* -- -- -- -- -- -- -- -- --  MG -- -- -- 2.0 -- -- 2.2 -- -- -- 2.4 --  PHOS -- -- -- 3.6 -- -- -- -- -- -- 5.7* --  AST -- -- -- -- -- -- 21 -- -- -- -- --  ALT -- -- -- -- -- --  17 -- -- -- -- --  ALKPHOS -- -- -- -- -- -- 45 -- -- -- -- --  BILITOT -- -- -- -- -- -- 0.2* -- -- -- -- --  PROT -- -- -- -- -- -- 6.3 -- -- -- -- --  ALBUMIN -- -- -- -- -- -- 2.7* -- -- -- -- --  APTT -- -- -- -- -- -- -- -- -- -- -- --  INR -- -- -- -- -- -- -- -- -- -- -- --  LATICACIDVEN -- -- -- -- -- -- -- -- -- -- 1.5 0.8  TROPONINI -- -- -- -- <0.30 -- -- <0.30 -- -- <0.30 --  PROCALCITON -- -- -- -- -- -- -- -- -- 0.24 -- --  PROBNP -- -- -- -- -- -- -- -- -- -- -- --  O2SATVEN -- -- -- -- -- -- -- -- -- -- -- --  PHART -- -- -- -- -- 7.401 -- -- 7.259* -- -- --  PCO2ART -- -- -- -- -- 23.0* -- -- 25.2* -- -- --  PO2ART -- -- -- -- -- 85.8 -- -- 83.6 -- -- --    Lab 04/23/12 0754 04/23/12 0419 04/22/12 2339 04/22/12 1933 04/22/12 1641  GLUCAP 114* 112* 111* 119* 127*    CXR: 1/31 No active disease  ASSESSMENT / PLAN:  PULMONARY A: No Acute Issue, H/O osa P:   CPAP during sleep  CARDIOVASCULAR A: Hypovolemic Shock Remains hypotensive after what should be  adequate volume resuscitation efforts. Additional complicating factors could include acidosis or adrenal insuff. Septic shock on diff dx but does not appear toxic nor does he meet SIRS criteria.  P:  - Aggressive fluid resuscitation, goal CVP achieved ( note left anamolousSVC ) -No need for stress dose steroids  -hold all antihypertensives -enalapril, aldactone  RENAL A:   Acute Renal failure Metabolic acidosis (both Non-gap/gap process) Hyperkalemia, Hyponatremia -resolved Likely multi-factorial, hypovolemia and antihypertensives: including Ace-I and diuretics. also received toradol  - Renal US -no hnosis P:   - decrease NS to 75/h - dc Bicarb gtt  -bmet q 12 now  GASTROINTESTINAL A:  Diarrhea - resolved P:   - isolation  HEMATOLOGIC A:  Anemia (chronic), anticipate hgb drop w/ next lab test d/t hemodilution.  P:  Supportive Care  INFECTIOUS A:  Potential infectious cause for diarrhea. ? C-diff vs other   Septic shock ? E coli O-157:H7 infection, though not likely as patient denies hematochezia. P:   F/u stool cultures and O&P dc Flagyl  ENDOCRINE A:  Hyperaldosteronism   cortisol- 20 P:   - Hold antihypertensives and diuretics   TODAY'S SUMMARY: SHock, ARf- resolving, polyuric phase  I have personally obtained a history, examined the patient, evaluated laboratory and imaging results, formulated the assessment and plan and placed orders.  CRITICAL CARE: The patient is critically ill with multiple organ systems failure and requires high complexity decision making for assessment and support, frequent evaluation and titration of therapies, application of advanced monitoring technologies and extensive interpretation of multiple databases. Critical Care Time devoted to patient care services described in this note is 31 minutes.    Cyril Mourning MD. Tonny Bollman. [facility] Pager [phone] If no response call [phone]   04/23/2012, 8:58 AM

## 2012-04-23 NOTE — Progress Notes (Signed)
eLink Physician-Brief Progress Note Patient Name: Kenneth Barrett DOB: Aug 19, 1970 MRN: [id number]  Date of Service  04/23/2012   HPI/Events of Note  Continued abd pain despite dosing with Maalox.  Patient now stating that abd pain is 10/10 but VS do not reflect this level of pain with HR of 85 BP of 107/53   eICU Interventions  Plan: Will try GI cocktail.  If this does not give relief with try IV fentanyl.   Intervention Category Intermediate Interventions: Pain - evaluation and management  [name] 04/23/2012, 3:17 AM

## 2012-04-23 NOTE — Progress Notes (Signed)
RT set patient up on CPAP with nasal mask. CPAP pressure set at 8cm H2O per patient home settings. Sterile water added to fill line. Patient states he is comfortable and knows to call RT should he need any assistance.

## 2012-04-24 ENCOUNTER — Inpatient Hospital Stay (HOSPITAL_COMMUNITY): Payer: BC Managed Care – PPO

## 2012-04-24 LAB — BASIC METABOLIC PANEL
Calcium: 7.9 mg/dL — ABNORMAL LOW (ref 8.4–10.5)
Calcium: 8.3 mg/dL — ABNORMAL LOW (ref 8.4–10.5)
Creatinine, Ser: 1.65 mg/dL — ABNORMAL HIGH (ref 0.50–1.35)
GFR calc Af Amer: 58 mL/min — ABNORMAL LOW (ref >90)
GFR calc non Af Amer: 50 mL/min — ABNORMAL LOW (ref >90)
GFR calc non Af Amer: 58 mL/min — ABNORMAL LOW (ref >90)
Sodium: 135 mEq/L (ref 135–145)

## 2012-04-24 MED ORDER — FAMOTIDINE 20 MG PO TABS
20.0000 mg | ORAL_TABLET | Freq: Every day | ORAL | Status: DC
  Administered 2012-04-24 – 2012-04-25 (×2): 20 mg via ORAL
  Filled 2012-04-24 (×3): qty 1

## 2012-04-24 MED ORDER — CHLORHEXIDINE GLUCONATE CLOTH 2 % EX PADS
6.0000 | MEDICATED_PAD | Freq: Every day | CUTANEOUS | Status: DC
  Administered 2012-04-25: 6 via TOPICAL

## 2012-04-24 MED ORDER — ONDANSETRON HCL 4 MG/2ML IJ SOLN: 4.0000 mg | Freq: Four times a day (QID) | INTRAMUSCULAR | Status: DC | PRN

## 2012-04-24 MED ORDER — HYDROCODONE-ACETAMINOPHEN 5-325 MG PO TABS
1.0000 | ORAL_TABLET | Freq: Once | ORAL | Status: AC
  Administered 2012-04-24: 1 via ORAL
  Filled 2012-04-24: qty 1

## 2012-04-24 NOTE — Progress Notes (Signed)
MT reports that patients HR was in 80's, but for several seconds it dropped to 60's and even 48. Presently it returned to 80's which is baseline. I assessed pt and he was resting and asymptomatic. I will continue to monitor for other episodes similar to this.

## 2012-04-24 NOTE — Progress Notes (Signed)
eLink Physician-Brief Progress Note Patient Name: Kenneth Barrett DOB: November 21, 1970 MRN: [id number]  Date of Service  04/24/2012   HPI/Events of Note   Nurse called to report patient c/o of back pain.  eICU Interventions  Plan: One time order for vicodin 5-325 mg po   Intervention Category Intermediate Interventions: Pain - evaluation and management  DETERDING,ELIZABETH 04/24/2012, 5:51 AM

## 2012-04-24 NOTE — Progress Notes (Signed)
Pt cpap is set on 8 cmH2O per home setting. Pt says he can place on himself when he's ready. Water chamber filled & told pt to call if he had any problems with it.  Jacqulynn Cadet RRT

## 2012-04-24 NOTE — Progress Notes (Signed)
Call to PCCM (Minor) as pt became nauseous after sitting chair for aprox 30 min. Pt did vomit aprox 125cc of clear emesis. Only other complaint is gas, low in abd. Pt assisted back to bed, and zofran given as ordered. Pt resting, will observe closely.

## 2012-04-24 NOTE — Progress Notes (Signed)
Patient CC lower back pain 6/10 that radiates outward in middle to lower back. Called Dr. Detterding for pain meds. Order given for vicodin. Other than patient stating he was not able to get comfortable in bed, he had an uneventful night.

## 2012-04-24 NOTE — Progress Notes (Signed)
Pt states he feels much better. Wants to go over to chair again.

## 2012-04-24 NOTE — Progress Notes (Addendum)
PULMONARY  / CRITICAL CARE MEDICINE  Name: Kenneth Barrett MRN: [id number] DOB: 1970-11-28    ADMISSION DATE:  04/21/2012 CONSULTATION DATE:  1/31  REFERRING MD :  Effie Shy  CHIEF COMPLAINT:  Diarrhea  BRIEF PATIENT DESCRIPTION:  42yo male with a history of hyperaldosteronism complaining of flu like symptoms since 04/16/12. 1/28 he developed diarrhea and dizziness, which caused him to fall twice, no urine output since 1/29 and describes back/flank pain during this period > to ED 1/31 w/ c/o weakness. Found to be hypotensive which did not respond to 5 liters IVF resuscitation and in acute renal failure w/ metabolic acidosis with POS pcr for Influenza A  Home meds include enalapril & aldactone  SIGNIFICANT EVENTS / STUDIES:  Renal US 1/31>>>no hydroneprosis      CULTURES: Urine 1/31 >neg BC 1/31>>>ng Stool 2/1 > no suspicious colonies C-diff 1/31>>>neg O&p 1/31>>> PCR > POS INLFUENZA A A 2/2 uc>>neg  ANTIBIOTICS: FLAGYL 1/31>  2/1 Tamiflu 2/1 >>  SUBJ    VITAL SIGNS: Temp:  [98.5 F (36.9 C)-98.7 F (37.1 C)] 98.5 F (36.9 C) (02/03 0509) Pulse Rate:  [74-88] 76  (02/03 0509) Resp:  [18-20] 18  (02/03 0509) BP: (118-136)/(58-78) 119/65 mmHg (02/03 0509) SpO2:  [94 %-97 %] 97 % (02/03 0509) Weight:  [193.414 kg (426 lb 6.4 oz)] 193.414 kg (426 lb 6.4 oz) (02/03 0509) FIO2  RA   INTAKE / OUTPUT: Intake/Output      02/02 0701 - 02/03 0700 02/03 0701 - 02/04 0700   P.O.     I.V. (mL/kg) 715 (3.7)    Other     IV Piggyback 50    Total Intake(mL/kg) 765 (4)    Urine (mL/kg/hr) 1575 (0.3)    Total Output 1575    Net -810         Stool Occurrence 1 x      PHYSICAL EXAMINATION: General:  MO NAD Neuro:  AAOx4 HEENT:  Normocephalic, PERRL Cardiovascular:  S1, S2 distant, intact. No gallops, rubs, murmurs.  Lungs:  Decreased BS BL Abdomen:  BS normo-hyperactive x 4 quadrants. Soft, non-distended, generalized tenderness.  Musculoskeletal:  ROM wnl x4 extremities.   Skin:  Warm, dry, intact.   LABS:  Lab 04/24/12 0200 04/23/12 1355 04/23/12 0319 04/22/12 4098 04/22/12 0215 04/22/12 0205 04/22/12 0015 04/21/12 2009 04/21/12 1413 04/21/12 1412 04/21/12 1318 04/21/12 0900  HGB -- -- 8.1* 8.1* -- -- 8.9* -- -- -- -- --  WBC -- -- 6.4 8.6 -- -- 8.5 -- -- -- -- --  PLT -- -- 156 200 -- -- 209 -- -- -- -- --  NA 132* 135 135 -- -- -- -- -- -- -- -- --  K 3.8 3.7 -- -- -- -- -- -- -- -- -- --  CL 96 97 101 -- -- -- -- -- -- -- -- --  CO2 28 28 26  -- -- -- -- -- -- -- -- --  GLUCOSE 87 96 109* -- -- -- -- -- -- -- -- --  BUN 19 24* 30* -- -- -- -- -- -- -- -- --  CREATININE 1.65* 1.92* 2.34* -- -- -- -- -- -- -- -- --  CALCIUM 7.9* 7.9* 7.5* -- -- -- -- -- -- -- -- --  MG -- -- -- 2.0 -- -- 2.2 -- -- -- 2.4 --  PHOS -- -- -- 3.6 -- -- -- -- -- -- 5.7* --  AST -- -- -- -- -- -- 21 -- -- -- -- --  ALT -- -- -- -- -- -- 17 -- -- -- -- --  ALKPHOS -- -- -- -- -- -- 45 -- -- -- -- --  BILITOT -- -- -- -- -- -- 0.2* -- -- -- -- --  PROT -- -- -- -- -- -- 6.3 -- -- -- -- --  ALBUMIN -- -- -- -- -- -- 2.7* -- -- -- -- --  APTT -- -- -- -- -- -- -- -- -- -- -- --  INR -- -- -- -- -- -- -- -- -- -- -- --  LATICACIDVEN -- -- -- -- -- -- -- -- -- -- 1.5 0.8  TROPONINI -- -- -- -- <0.30 -- -- <0.30 -- -- <0.30 --  PROCALCITON -- -- -- -- -- -- -- -- -- 0.24 -- --  PROBNP -- -- -- -- -- -- -- -- -- -- -- --  O2SATVEN -- -- -- -- -- -- -- -- -- -- -- --  PHART -- -- -- -- -- 7.401 -- -- 7.259* -- -- --  PCO2ART -- -- -- -- -- 23.0* -- -- 25.2* -- -- --  PO2ART -- -- -- -- -- 85.8 -- -- 83.6 -- -- --    Lab 04/23/12 1243 04/23/12 0754 04/23/12 0419 04/22/12 2339 04/22/12 1933  GLUCAP 98 114* 112* 111* 119*    CXR:  No results found.   ASSESSMENT / PLAN:  PULMONARY A: No Acute Issue x OSA chronically P:   CPAP during sleep  CARDIOVASCULAR A: Hypovolemic Shock Remained hypotensive after what should be adequate volume resuscitation efforts.  Resolved  2-2 P:  -No need for stress dose steroids  -holding all antihypertensives -enalapril, aldactone  RENAL Lab Results  Component Value Date   CREATININE 1.65* 04/24/2012   CREATININE 1.92* 04/23/2012   CREATININE 2.34* 04/23/2012   CREATININE 1.19 06/23/2011    A:   Acute Renal failure resolving Metabolic acidosis (both Non-gap/gap process) Hyperkalemia, Hyponatremia -resolved -- Renal US -no obst Likely multi-factorial, hypovolemia and antihypertensives: including Ace-I and diuretics. also received toradol   P:   -  NS at 50 cc per hour   -bmet q am  GASTROINTESTINAL A:  Diarrhea - resolved N/V P:   - isolation -zofran  HEMATOLOGIC A:  Anemia (chronic)    P: monitor  INFECTIOUS A:  Potential infectious cause for diarrhea. ? C-diff vs other   Septic shock ? E coli O-157:H7 infection, though not likely as patient denies hematochezia. P:   F/u stool cultures and O&P Empirical tamiflu x 5 days planned so stop 2/6    ENDOCRINE A:  Hyperaldosteronism   cortisol- 20 P:   No need for steroids       Sandrea Hughs, MD Pulmonary and Critical Care Medicine [facility] Cell [phone] After 5:30 PM or weekends, call [phone]

## 2012-04-25 LAB — BASIC METABOLIC PANEL
GFR calc Af Amer: 69 mL/min — ABNORMAL LOW (ref >90)
GFR calc non Af Amer: 59 mL/min — ABNORMAL LOW (ref >90)
Potassium: 3.8 mEq/L (ref 3.5–5.1)
Sodium: 133 mEq/L — ABNORMAL LOW (ref 135–145)

## 2012-04-25 MED ORDER — SODIUM CHLORIDE 0.9 % IJ SOLN
10.0000 mL | INTRAMUSCULAR | Status: DC | PRN
  Administered 2012-04-25: 10 mL

## 2012-04-25 NOTE — Progress Notes (Signed)
PULMONARY  / CRITICAL CARE MEDICINE  Name: Kenneth Barrett MRN: [id number] DOB: [birth year]    ADMISSION DATE:  04/21/2012 CONSULTATION DATE:  1/31  REFERRING MD :  Effie Shy  CHIEF COMPLAINT:  Diarrhea  BRIEF PATIENT DESCRIPTION:  42yo male with a history of hyperaldosteronism complaining of flu like symptoms since 04/16/12. 1/28 he developed diarrhea and dizziness, which caused him to fall twice, no urine output since 1/29 and describes back/flank pain during this period > to ED 1/31 w/ c/o weakness. Found to be hypotensive which did not respond to 5 liters IVF resuscitation and in acute renal failure w/ metabolic acidosis with POS pcr for Influenza A  Home meds include enalapril & aldactone  SIGNIFICANT EVENTS / STUDIES:  Renal US 1/31>>>no hydronephrosis      CULTURES: Urine 1/31 >neg BC 1/31>>>ng Stool 2/1 > no suspicious colonies C-diff 1/31>>>neg O&p 1/31>>>pennding  PCR 1/31 > POS INLFUENZA  A  2/2 uc>>neg  ANTIBIOTICS: FLAGYL 1/31>  2/1 Tamiflu 2/1 >>  SUBJ   Sitting in chair, no sob or cough and wants to go home  VITAL SIGNS: Temp:  [97.7 F (36.5 C)-98.7 F (37.1 C)] 98.7 F (37.1 C) (02/04 0523) Pulse Rate:  [77-82] 82  (02/04 0523) Resp:  [16-20] 16  (02/04 0523) BP: (110-130)/(61-73) 130/73 mmHg (02/04 0523) SpO2:  [95 %-100 %] 97 % (02/04 0523) Weight:  [189.604 kg (418 lb)] 189.604 kg (418 lb) (02/04 0523) FIO2  RA   INTAKE / OUTPUT: Intake/Output      02/03 0701 - 02/04 0700 02/04 0701 - 02/05 0700   P.O. 480 360   I.V. (mL/kg) 600 (3.2)    IV Piggyback     Total Intake(mL/kg) 1080 (5.7) 360 (1.9)   Urine (mL/kg/hr) 300 (0.1)    Stool 1    Total Output 301    Net +779 +360          PHYSICAL EXAMINATION: General:  MO NAD, feels better Neuro:  AAOx4 HEENT:  Normocephalic, PERRL Cardiovascular:  S1, S2 distant, intact. No gallops, rubs, murmurs.  Lungs:  Decreased BS BL Abdomen:  BS normo-hyperactive x 4 quadrants. Soft, non-distended,  generalized tenderness. No n/v Musculoskeletal:  ROM wnl x4 extremities.  Skin:  Warm, dry, intact.   LABS:  Lab 04/25/12 0330 04/24/12 1425 04/24/12 0200 04/23/12 0319 04/22/12 4098 04/22/12 0215 04/22/12 0205 04/22/12 0015 04/21/12 2009 04/21/12 1413 04/21/12 1412 04/21/12 1318 04/21/12 0900  HGB -- -- -- 8.1* 8.1* -- -- 8.9* -- -- -- -- --  WBC -- -- -- 6.4 8.6 -- -- 8.5 -- -- -- -- --  PLT -- -- -- 156 200 -- -- 209 -- -- -- -- --  NA 133* 135 132* -- -- -- -- -- -- -- -- -- --  K 3.8 3.8 -- -- -- -- -- -- -- -- -- -- --  CL 99 100 96 -- -- -- -- -- -- -- -- -- --  CO2 28 27 28  -- -- -- -- -- -- -- -- -- --  GLUCOSE 105* 88 87 -- -- -- -- -- -- -- -- -- --  BUN 16 17 19  -- -- -- -- -- -- -- -- -- --  CREATININE 1.43* 1.45* 1.65* -- -- -- -- -- -- -- -- -- --  CALCIUM 8.1* 8.3* 7.9* -- -- -- -- -- -- -- -- -- --  MG -- -- -- -- 2.0 -- -- 2.2 -- -- -- 2.4 --  PHOS -- -- -- --  3.6 -- -- -- -- -- -- 5.7* --  AST -- -- -- -- -- -- -- 21 -- -- -- -- --  ALT -- -- -- -- -- -- -- 17 -- -- -- -- --  ALKPHOS -- -- -- -- -- -- -- 45 -- -- -- -- --  BILITOT -- -- -- -- -- -- -- 0.2* -- -- -- -- --  PROT -- -- -- -- -- -- -- 6.3 -- -- -- -- --  ALBUMIN -- -- -- -- -- -- -- 2.7* -- -- -- -- --  APTT -- -- -- -- -- -- -- -- -- -- -- -- --  INR -- -- -- -- -- -- -- -- -- -- -- -- --  LATICACIDVEN -- -- -- -- -- -- -- -- -- -- -- 1.5 0.8  TROPONINI -- -- -- -- -- <0.30 -- -- <0.30 -- -- <0.30 --  PROCALCITON -- -- -- -- -- -- -- -- -- -- 0.24 -- --  PROBNP -- -- -- -- -- -- -- -- -- -- -- -- --  O2SATVEN -- -- -- -- -- -- -- -- -- -- -- -- --  PHART -- -- -- -- -- -- 7.401 -- -- 7.259* -- -- --  PCO2ART -- -- -- -- -- -- 23.0* -- -- 25.2* -- -- --  PO2ART -- -- -- -- -- -- 85.8 -- -- 83.6 -- -- --    Lab 04/23/12 1243 04/23/12 0754 04/23/12 0419 04/22/12 2339 04/22/12 1933  GLUCAP 98 114* 112* 111* 119*    CXR:  No results found.   ASSESSMENT / PLAN:  PULMONARY A: No Acute Issue x  OSA chronically P:   CPAP during sleep  CARDIOVASCULAR A: Hypovolemic Shock Remained hypotensive after what should be adequate volume resuscitation efforts.  Resolved 2-2 P:  -No need for stress dose steroids  -holding all antihypertensives -enalapril, aldactone  RENAL Lab Results  Component Value Date   CREATININE 1.43* 04/25/2012   CREATININE 1.45* 04/24/2012   CREATININE 1.65* 04/24/2012   CREATININE 1.19 06/23/2011    A:   Acute Renal failure resolving Metabolic acidosis (both Non-gap/gap process) Hyperkalemia, Hyponatremia -resolved -- Renal US -no obst Likely multi-factorial, hypovolemia and antihypertensives: including Ace-I and diuretics. also received toradol   P:   -  NS at 50 cc per hour   -bmet q am  GASTROINTESTINAL A:  Diarrhea - resolved N/V P:   - isolation -zofran  HEMATOLOGIC  Basename 04/23/12 0319  HGB 8.1*    A:  Anemia (chronic)    P: monitor  INFECTIOUS A:  Potential infectious cause for diarrhea. ? C-diff vs other   Septic shock made worse on ACEI pta  P:   F/u stool cultures and O&P Empirical tamiflu x 5 days planned so stop 2/6    ENDOCRINE A:  Hyperaldosteronism   cortisol- 20 P:   No need for steroids     Plan to dc 2/5 if remains stable.   [name] ACNP [facility] Pager [phone] till 3 pm If no answer page [phone] 04/25/2012, 12:11 PM   Pt seen and examined and chart reviewed. Creat appears to be plateauing around 1.4 but still not comfortable restarting ACEI and may need to avoid this class in future  Sandrea Hughs, MD Pulmonary and Critical Care Medicine [facility] Cell [phone] After 5:30 PM or weekends, call [phone]

## 2012-04-25 NOTE — Care Management Note (Signed)
    Page 1 of 1   04/26/2012     1:29:26 PM   CARE MANAGEMENT NOTE 04/26/2012  Patient:  TKAI, SERFASS A   Account Number:  [id number]  Date Initiated:  04/22/2012  Documentation initiated by:  [facility]  Subjective/Objective Assessment:   42 year old male admitted with shock,     Action/Plan:   Resided at home PTA.   Anticipated DC Date:  04/26/2012   Anticipated DC Plan:  HOME/SELF CARE         Choice offered to / List presented to:             Status of service:  Completed, signed off Medicare Important Message given?  NA - LOS <3 / Initial given by admissions (If response is "NO", the following Medicare IM given date fields will be blank) Date Medicare IM given:   Date Additional Medicare IM given:    Discharge Disposition:  HOME/SELF CARE  Per UR Regulation:  Reviewed for med. necessity/level of care/duration of stay  If discussed at Long Length of Stay Meetings, dates discussed:    Comments:  04/26/12 Isobelle Tuckett RN,BSN NCM [phone] D/C NO NEEDS OR ORDERS. 04/25/12 Lavontay Kirk RN,BSN NCM [phone] TRANSFER FROM SDU.NO D/C NEEDS.

## 2012-04-25 NOTE — Progress Notes (Signed)
At approximately 0800 today pt had episode of bradycardia & had a "dropped p-wave".  Nurse at Dr.Byrum's office was notified of event & that pt was asymptomatic. Pt states that his heart rate will decrease if he sleeps without c-pap and that he may have fallen asleep at this time. Will continue to monitor pt for any other events. Kenneth Barrett

## 2012-04-26 LAB — STOOL CULTURE

## 2012-04-26 LAB — BASIC METABOLIC PANEL
Chloride: 100 mEq/L (ref 96–112)
Creatinine, Ser: 1.36 mg/dL — ABNORMAL HIGH (ref 0.50–1.35)
GFR calc Af Amer: 73 mL/min — ABNORMAL LOW (ref >90)
Potassium: 3.8 mEq/L (ref 3.5–5.1)

## 2012-04-26 LAB — OVA AND PARASITE EXAMINATION: Ova and parasites: NONE SEEN

## 2012-04-26 MED ORDER — OSELTAMIVIR PHOSPHATE 75 MG PO CAPS: 75.0000 mg | ORAL_CAPSULE | Freq: Two times a day (BID) | ORAL | Status: DC

## 2012-04-26 NOTE — Discharge Summary (Signed)
Physician Discharge Summary  Patient ID: Kenneth Barrett MRN: [id number] DOB/AGE: Jan 18, 1971 42 y.o.  Admit date: 04/21/2012 Discharge date: 04/26/2012  Problem List Active Problems:  Shock  Diarrhea  Acute renal failure  Metabolic acidosis  Hyperaldosteronism  Influenza  HPI: 41yobm on acei chronically history of hyperaldosteronism complaining of flu like symptoms since 04/16/12. 1/28 he developed diarrhea and dizziness, which caused him to fall twice, no urine output since 1/29 and describes back/flank pain during this period > to ED 1/31 w/ c/o weakness. Found to be hypotensive which did not respond to 5 liters IVF resuscitation and in acute renal failure w/ metabolic acidosis with POS pcr for Influenza A  Hospital Course: Renal US 1/31>>>no hydronephrosis  CULTURES:  Urine 1/31 >neg  BC 1/31>>>ng  Stool 2/1 > no suspicious colonies  C-diff 1/31>>>neg  O&p 1/31> neg PCR 1/31 > POS INLFUENZA A 2/2 uc>>neg   ANTIBIOTICS:  FLAGYL 1/31> 2/1  Tamiflu 2/1 >>  ASSESSMENT / PLAN:  PULMONARY  A: No Acute Issue x OSA chronically  P:  CPAP during sleep  CARDIOVASCULAR  A: Hypovolemic Shock  Remained hypotensive after what should be adequate volume resuscitation efforts.  Resolved 2-2  Establish with Kenneth Barrett as sleep specialist. P:  -No need for stress dose steroids  -holding all antihypertensives -enalapril, aldactone  RENAL  Lab Results   Component  Value  Date    CREATININE  1.43*  04/25/2012    CREATININE  1.45*  04/24/2012    CREATININE  1.65*  04/24/2012    CREATININE  1.19  06/23/2011    A:  Acute Renal failure resolving  Metabolic acidosis (both Non-gap/gap process)  Hyperkalemia, Hyponatremia -resolved  -- Renal US -no obst  Likely multi-factorial, hypovolemia and antihypertensives: including Ace-I and diuretics. also received toradol , resolved   GASTROINTESTINAL  A: Diarrhea - resolved N/V      HEMATOLOGIC   Basename  04/23/12 0319   HGB  8.1*    A:  Anemia (chronic)  P: monitor   INFECTIOUS  A: Potential infectious cause for diarrhea. ? C-diff vs other  Septic shock made worse on ACEI pta  P:    Empirical tamiflu x 10 days planned so stop 2/10  ENDOCRINE  A: Hyperaldosteronism  cortisol- 20  P:  No need for steroids       Labs at discharge Lab Results  Component Value Date   CREATININE 1.36* 04/26/2012   BUN 14 04/26/2012   NA 135 04/26/2012   K 3.8 04/26/2012   CL 100 04/26/2012   CO2 27 04/26/2012   Lab Results  Component Value Date   WBC 6.4 04/23/2012   HGB 8.1* 04/23/2012   HCT 25.0* 04/23/2012   MCV 67.2* 04/23/2012   PLT 156 04/23/2012   Lab Results  Component Value Date   ALT 17 04/22/2012   AST 21 04/22/2012   ALKPHOS 45 04/22/2012   BILITOT 0.2* 04/22/2012   No results found for this basename: INR, PROTIME    Current radiology studies No results found.  Disposition:  Final discharge disposition not confirmed  Discharge Orders    Future Appointments: Provider: Department: Dept Phone: Center:   05/17/2012 3:00 PM Kenneth Share, MD [facility] [phone] None     Future Orders Please Complete By Expires   Discharge patient          Medication List     As of 04/26/2012  1:24 PM    STOP taking these medications  enalapril 20 MG tablet   Commonly known as: VASOTEC      hydrochlorothiazide 25 MG tablet   Commonly known as: HYDRODIURIL      potassium chloride 10 MEQ tablet   Commonly known as: K-DUR      spironolactone 50 MG tablet   Commonly known as: ALDACTONE      TAKE these medications         amLODipine 10 MG tablet   Commonly known as: NORVASC   Take 10 mg by mouth Daily.      atenolol 50 MG tablet   Commonly known as: TENORMIN   Take 50 mg by mouth Daily.      FLUoxetine 10 MG capsule   Commonly known as: PROZAC   Take 10 mg by mouth daily.      oseltamivir 75 MG capsule   Commonly known as: TAMIFLU   Take 1 capsule (75 mg total) by mouth 2 (two) times daily.            Follow-up Information    Please follow up. (Kenneth Barrett 04-28-12 11 am)       Follow up with Kenneth Share, MD. On 05/17/2012. (3:00 pm)    Contact information:   [address] [place] [address] [phone]          Discharged Condition:   good Vital signs at Discharge. Temp:  [97.8 F (36.6 C)-98.8 F (37.1 C)] 98.4 F (36.9 C) (02/05 0428) Pulse Rate:  [89-92] 92  (02/05 0428) Resp:  [18] 18  (02/05 0428) BP: (121-133)/(70-84) 131/70 mmHg (02/05 0428) SpO2:  [94 %-98 %] 94 % (02/05 0428) Weight:  [191 kg (421 lb 1.3 oz)] 191 kg (421 lb 1.3 oz) (02/05 0428) FIO2 RA  Office follow up Special Information or instructions. See your primary care on 2-7 and established yourself with Kenneth. Shelle Barrett for OSA care.    Kenneth Barrett ACNP Kenneth Barrett PCCM Pager [phone] till 3 pm If no answer page [phone] 04/26/2012, 1:24 PM  Pt seen and examined and labs reviewed, agree with discharge plan including leaving off acei until back to baseline then caution careful f/u serial bmet if need acei or arb for hbp control as outpt  Kenneth Hughs, MD

## 2012-04-27 LAB — CULTURE, BLOOD (ROUTINE X 2): Culture: NO GROWTH

## 2012-04-28 NOTE — ED Provider Notes (Signed)
See my associated note.   Medical screening examination/treatment/procedure(s) were conducted as a shared visit with non-physician practitioner(s) and myself.  I personally evaluated the patient during the encounter  Flint Melter, MD 04/28/12 [phone]

## 2012-05-17 ENCOUNTER — Encounter: Payer: Self-pay | Admitting: Pulmonary Disease

## 2012-05-17 ENCOUNTER — Ambulatory Visit (INDEPENDENT_AMBULATORY_CARE_PROVIDER_SITE_OTHER): Payer: BC Managed Care – PPO | Admitting: Pulmonary Disease

## 2012-05-17 VITALS — BP 134/84 | HR 84 | Temp 98.1°F | Ht 68.75 in | Wt >= 6400 oz

## 2012-05-17 DIAGNOSIS — G4733 Obstructive sleep apnea (adult) (pediatric): Secondary | ICD-10-CM

## 2012-05-17 NOTE — Progress Notes (Signed)
Subjective:    Patient ID: Kenneth Barrett, male    DOB: March 22, 1971, 42 y.o.   MRN: [id number]  HPI The patient is a 42 year old male who been asked to see for management of obstructive sleep apnea.  He was diagnosed in 2005 at [facility] with severe sleep apnea by his history.  He was started on CPAP, and tells me that his pressure is set at 8 cm.  He has worn CPAP compliantly since that time and has done very well.  Unfortunately, he has not changed his ptosis her filters since 2005.  His last mask change was approximately one year ago.  The patient states that he sleeps well with his device, and has not had any breakthrough snoring.  He is rested in the mornings upon arising, and has excellent alertness during the day.  His Epworth sleepiness score today is only 5.  The patient states that his weight is up 50 pounds from 2005, and 20 pounds over the last 2 years.  Sleep Questionnaire What time do you typically go to bed?( Between what hours) 1:30-2am 1:30-2am at 1527 on 05/17/12 by Nita Sells, CMA How long does it take you to fall asleep? 5-54mins 5-26mins at 1527 on 05/17/12 by Nita Sells, CMA How many times during the night do you wake up? 1 1 at 1527 on 05/17/12 by Nita Sells, CMA What time do you get out of bed to start your day? 1100 1100 at 1527 on 05/17/12 by Nita Sells, CMA Do you drive or operate heavy machinery in your occupation? No No at 1527 on 05/17/12 by Nita Sells, CMA How much has your weight changed (up or down) over the past two years? (In pounds) 20 lb (9.072 kg)20 lb (9.072 kg) increase at 1527 on 05/17/12 by Nita Sells, CMA Have you ever had a sleep study before? Yes Yes at 1527 on 05/17/12 by Nita Sells, CMA If yes, location of study? [facility] at 1527 on 05/17/12 by Nita Sells, CMA If yes, date of study? 2005 2005 at 1527 on 05/17/12 by Nita Sells, CMA Do you currently use CPAP? Yes Yes at 1527 on  05/17/12 by Marjo Bicker Mabe, CMA If so, what pressure? 8 8 at 1527 on 05/17/12 by Marjo Bicker Mabe, CMA Do you wear oxygen at any time? No No at 1527 on 05/17/12 by Marjo Bicker Mabe, CMA    Review of Systems  Constitutional: Negative for fever and unexpected weight change.  HENT: Negative for ear pain, nosebleeds, congestion, sore throat, rhinorrhea, sneezing, trouble swallowing, dental problem, postnasal drip and sinus pressure.   Eyes: Negative for redness and itching.  Respiratory: Negative for cough, chest tightness, shortness of breath and wheezing.   Cardiovascular: Positive for leg swelling. Negative for palpitations.  Gastrointestinal: Negative for nausea and vomiting.  Genitourinary: Negative for dysuria.  Musculoskeletal: Negative for joint swelling.  Skin: Negative for rash.  Neurological: Negative for headaches.  Hematological: Does not bruise/bleed easily.  Psychiatric/Behavioral: Negative for dysphoric mood. The patient is not nervous/anxious.        Objective:   Physical Exam Constitutional:  Morbidly obese male, no acute distress  HENT:  Nares patent without discharge, but large turbinates.  Oropharynx without exudate, palate and uvula are thick and elongated.  +tonsillar hypertrophy.   Eyes:  Perrla, eomi, no scleral icterus  Neck:  No JVD, no TMG  Cardiovascular:  Normal rate, regular rhythm, no  rubs or gallops.  No murmurs        Intact distal pulses  Pulmonary :  Normal breath sounds, no stridor or respiratory distress   No rales, rhonchi, or wheezing  Abdominal:  Soft, nondistended, bowel sounds present.  No tenderness noted.   Musculoskeletal: 1+ lower extremity edema noted.  Lymph Nodes:  No cervical lymphadenopathy noted  Skin:  No cyanosis noted  Neurologic:  Alert, appropriate, moves all 4 extremities without obvious deficit.  Does not appear to be sleepy.          Assessment & Plan:

## 2012-05-17 NOTE — Patient Instructions (Addendum)
Will get you a new cpap machine and mask Will take this opportunity to re-optimize your pressure on the auto setting, and will call you once I receive the download after 2 weeks. Work on weight loss If doing well, followup with me in one year.

## 2012-05-17 NOTE — Assessment & Plan Note (Signed)
The patient has a history of severe obstructive sleep apnea, and has done extremely well on CPAP since his diagnosis in 2005.  He currently uses his same machine, but has not changed the hoses or filters in 9 years.  His mask is also due for replacement, as is his CPAP machine.  I would like to get him a new device, and also take this opportunity to optimize his CPAP pressure again.  I also stressed to him the importance of aggressive weight loss.  He will follow up with me in one year doing well.

## 2012-10-09 ENCOUNTER — Encounter: Payer: Self-pay | Admitting: Internal Medicine

## 2012-10-09 ENCOUNTER — Ambulatory Visit (INDEPENDENT_AMBULATORY_CARE_PROVIDER_SITE_OTHER): Payer: BC Managed Care – PPO | Admitting: Internal Medicine

## 2012-10-09 ENCOUNTER — Other Ambulatory Visit (INDEPENDENT_AMBULATORY_CARE_PROVIDER_SITE_OTHER): Payer: BC Managed Care – PPO

## 2012-10-09 VITALS — BP 120/80 | HR 74 | Temp 97.9°F | Resp 16 | Ht 69.75 in | Wt >= 6400 oz

## 2012-10-09 DIAGNOSIS — Z9989 Dependence on other enabling machines and devices: Secondary | ICD-10-CM | POA: Insufficient documentation

## 2012-10-09 DIAGNOSIS — I10 Essential (primary) hypertension: Secondary | ICD-10-CM

## 2012-10-09 DIAGNOSIS — Z0000 Encounter for general adult medical examination without abnormal findings: Secondary | ICD-10-CM

## 2012-10-09 DIAGNOSIS — R739 Hyperglycemia, unspecified: Secondary | ICD-10-CM | POA: Insufficient documentation

## 2012-10-09 DIAGNOSIS — G4733 Obstructive sleep apnea (adult) (pediatric): Secondary | ICD-10-CM

## 2012-10-09 DIAGNOSIS — R7309 Other abnormal glucose: Secondary | ICD-10-CM

## 2012-10-09 DIAGNOSIS — E269 Hyperaldosteronism, unspecified: Secondary | ICD-10-CM

## 2012-10-09 DIAGNOSIS — Z23 Encounter for immunization: Secondary | ICD-10-CM

## 2012-10-09 LAB — CBC WITH DIFFERENTIAL/PLATELET
Basophils Absolute: 0 10*3/uL (ref 0.0–0.1)
Eosinophils Absolute: 0 10*3/uL (ref 0.0–0.7)
MCHC: 32.8 g/dL (ref 30.0–36.0)
MCV: 62.3 fl — ABNORMAL LOW (ref 78.0–100.0)
Monocytes Absolute: 0.5 10*3/uL (ref 0.1–1.0)
Neutrophils Relative %: 75.7 % (ref 43.0–77.0)
Platelets: 90 10*3/uL — ABNORMAL LOW (ref 150.0–400.0)
RDW: 22.6 % — ABNORMAL HIGH (ref 11.5–14.6)
WBC: 6.9 10*3/uL (ref 4.5–10.5)

## 2012-10-09 LAB — COMPREHENSIVE METABOLIC PANEL
AST: 18 U/L (ref 0–37)
Albumin: 4 g/dL (ref 3.5–5.2)
Alkaline Phosphatase: 59 U/L (ref 39–117)
Glucose, Bld: 77 mg/dL (ref 70–99)
Potassium: 4 mEq/L (ref 3.5–5.1)
Sodium: 138 mEq/L (ref 135–145)
Total Protein: 7.7 g/dL (ref 6.0–8.3)

## 2012-10-09 LAB — LIPID PANEL
Cholesterol: 137 mg/dL (ref 0–200)
HDL: 34.4 mg/dL — ABNORMAL LOW (ref >39.00)
Triglycerides: 89 mg/dL (ref 0.0–149.0)

## 2012-10-09 LAB — FECAL OCCULT BLOOD, GUAIAC: Fecal Occult Blood: NEGATIVE

## 2012-10-09 MED ORDER — PNEUMOCOCCAL VAC POLYVALENT 25 MCG/0.5ML IJ INJ
0.5000 mL | INJECTION | Freq: Once | INTRAMUSCULAR | Status: AC
  Administered 2012-10-09: 0.5 mL via INTRAMUSCULAR

## 2012-10-09 MED ORDER — TETANUS-DIPHTH-ACELL PERTUSSIS 5-2.5-18.5 LF-MCG/0.5 IM SUSP
0.5000 mL | Freq: Once | INTRAMUSCULAR | Status: AC
  Administered 2012-10-09: 0.5 mL via INTRAMUSCULAR

## 2012-10-09 NOTE — Assessment & Plan Note (Signed)
Exam done Vaccines were updates Labs ordered Pt ed was given

## 2012-10-09 NOTE — Progress Notes (Signed)
Subjective:    Patient ID: Kenneth Barrett, male    DOB: September 02, 1970, 42 y.o.   MRN: [id number]  HPI Comments: New to me, previous PCP was ? In [place], no old records are available today. Hypertension This is a chronic problem. The current episode started more than 1 year ago. The problem has been gradually improving since onset. The problem is controlled. Pertinent negatives include no anxiety, blurred vision, chest pain, headaches, malaise/fatigue, neck pain, orthopnea, palpitations, peripheral edema, PND, shortness of breath or sweats. Past treatments include beta blockers and calcium channel blockers. Compliance problems include exercise and diet.  Hypertensive end-organ damage includes kidney disease. There is no history of heart failure, left ventricular hypertrophy, renovascular disease or a thyroid problem. Identifiable causes of hypertension include chronic renal disease, hyperaldosteronism and sleep apnea. There is no history of coarctation of the aorta, hypercortisolism, hyperparathyroidism, a hypertension causing med or pheochromocytoma.      Review of Systems  Constitutional: Negative.  Negative for fever, chills, malaise/fatigue, diaphoresis, activity change, appetite change, fatigue and unexpected weight change.  HENT: Negative.  Negative for neck pain.   Eyes: Negative.  Negative for blurred vision.  Respiratory: Positive for apnea. Negative for cough, choking, chest tightness, shortness of breath, wheezing and stridor.   Cardiovascular: Negative.  Negative for chest pain, palpitations, orthopnea, leg swelling and PND.  Gastrointestinal: Negative.  Negative for nausea, vomiting, abdominal pain, diarrhea, constipation and blood in stool.  Endocrine: Negative.   Genitourinary: Negative.   Musculoskeletal: Negative.  Negative for back pain, joint swelling, arthralgias and gait problem.  Skin: Negative.  Negative for color change, pallor, rash and wound.  Allergic/Immunologic:  Negative.   Neurological: Negative.  Negative for dizziness and headaches.  Hematological: Negative.  Negative for adenopathy. Does not bruise/bleed easily.  Psychiatric/Behavioral: Negative.        Objective:   Physical Exam  Vitals reviewed. Constitutional: He is oriented to person, place, and time. He appears well-developed and well-nourished. No distress.  HENT:  Head: Normocephalic and atraumatic.  Mouth/Throat: Oropharynx is clear and moist. No oropharyngeal exudate.  Eyes: Conjunctivae are normal. Right eye exhibits no discharge. Left eye exhibits no discharge. No scleral icterus.  Neck: Normal range of motion. Neck supple. No JVD present. No tracheal deviation present. No thyromegaly present.  Cardiovascular: Normal rate, regular rhythm, normal heart sounds and intact distal pulses.  Exam reveals no gallop and no friction rub.   No murmur heard. Pulmonary/Chest: Effort normal and breath sounds normal. No stridor. No respiratory distress. He has no wheezes. He has no rales. He exhibits no tenderness.  Abdominal: Soft. Bowel sounds are normal. He exhibits no distension and no mass. There is no tenderness. There is no rebound and no guarding. Hernia confirmed negative in the right inguinal area and confirmed negative in the left inguinal area.  Genitourinary: Rectum normal, prostate normal, testes normal and penis normal. Rectal exam shows no external hemorrhoid, no internal hemorrhoid, no fissure, no mass, no tenderness and anal tone normal. Guaiac negative stool. Prostate is not enlarged and not tender. Right testis shows no mass, no swelling and no tenderness. Right testis is descended. Left testis shows no mass, no swelling and no tenderness. Left testis is descended. Circumcised. No penile erythema or penile tenderness. No discharge found.  Musculoskeletal: Normal range of motion. He exhibits no edema and no tenderness.  Lymphadenopathy:    He has no cervical adenopathy.        Right: No inguinal adenopathy present.  Left: No inguinal adenopathy present.  Neurological: He is oriented to person, place, and time.  Skin: Skin is warm and dry. No rash noted. He is not diaphoretic. No erythema. No pallor.  Psychiatric: He has a normal mood and affect. His behavior is normal. Judgment and thought content normal.     Lab Results  Component Value Date   WBC 6.4 04/23/2012   HGB 8.1* 04/23/2012   HCT 25.0* 04/23/2012   PLT 156 04/23/2012   GLUCOSE 96 04/26/2012   CHOL 123 06/23/2011   TRIG 78 06/23/2011   HDL 41 06/23/2011   LDLCALC 66 06/23/2011   ALT 17 04/22/2012   AST 21 04/22/2012   NA 135 04/26/2012   K 3.8 04/26/2012   CL 100 04/26/2012   CREATININE 1.36* 04/26/2012   BUN 14 04/26/2012   CO2 27 04/26/2012   TSH 2.994 06/23/2011       Assessment & Plan:

## 2012-10-09 NOTE — Assessment & Plan Note (Signed)
His BP is well controlled Today I will check his lytes and renal function 

## 2012-10-09 NOTE — Assessment & Plan Note (Signed)
I have asked him to f/up with ENDO about this

## 2012-10-09 NOTE — Assessment & Plan Note (Signed)
I will check his A1C to see if he has developed DM2 

## 2012-10-09 NOTE — Patient Instructions (Signed)
Hypertension As your heart beats, it forces blood through your arteries. This force is your blood pressure. If the pressure is too high, it is called hypertension (HTN) or high blood pressure. HTN is dangerous because you may have it and not know it. High blood pressure may mean that your heart has to work harder to pump blood. Your arteries may be narrow or stiff. The extra work puts you at risk for heart disease, stroke, and other problems.  Blood pressure consists of two numbers, a higher number over a lower, 110/72, for example. It is stated as "110 over 72." The ideal is below 120 for the top number (systolic) and under 80 for the bottom (diastolic). Write down your blood pressure today. You should pay close attention to your blood pressure if you have certain conditions such as:  Heart failure.  Prior heart attack.  Diabetes  Chronic kidney disease.  Prior stroke.  Multiple risk factors for heart disease. To see if you have HTN, your blood pressure should be measured while you are seated with your arm held at the level of the heart. It should be measured at least twice. A one-time elevated blood pressure reading (especially in the Emergency Department) does not mean that you need treatment. There may be conditions in which the blood pressure is different between your right and left arms. It is important to see your caregiver soon for a recheck. Most people have essential hypertension which means that there is not a specific cause. This type of high blood pressure may be lowered by changing lifestyle factors such as:  Stress.  Smoking.  Lack of exercise.  Excessive weight.  Drug/tobacco/alcohol use.  Eating less salt. Most people do not have symptoms from high blood pressure until it has caused damage to the body. Effective treatment can often prevent, delay or reduce that damage. TREATMENT  When a cause has been identified, treatment for high blood pressure is directed at the  cause. There are a large number of medications to treat HTN. These fall into several categories, and your caregiver will help you select the medicines that are best for you. Medications may have side effects. You should review side effects with your caregiver. If your blood pressure stays high after you have made lifestyle changes or started on medicines,   Your medication(s) may need to be changed.  Other problems may need to be addressed.  Be certain you understand your prescriptions, and know how and when to take your medicine.  Be sure to follow up with your caregiver within the time frame advised (usually within two weeks) to have your blood pressure rechecked and to review your medications.  If you are taking more than one medicine to lower your blood pressure, make sure you know how and at what times they should be taken. Taking two medicines at the same time can result in blood pressure that is too low. SEEK IMMEDIATE MEDICAL CARE IF:  You develop a severe headache, blurred or changing vision, or confusion.  You have unusual weakness or numbness, or a faint feeling.  You have severe chest or abdominal pain, vomiting, or breathing problems. MAKE SURE YOU:   Understand these instructions.  Will watch your condition.  Will get help right away if you are not doing well or get worse. Document Released: 03/08/2005 Document Revised: 05/31/2011 Document Reviewed: 10/27/2007 ExitCare Patient Information 2014 [employer]. Health Maintenance, Males A healthy lifestyle and preventative care can promote health and wellness.  Maintain   regular health, dental, and eye exams.  Eat a healthy diet. Foods like vegetables, fruits, whole grains, low-fat dairy products, and lean protein foods contain the nutrients you need without too many calories. Decrease your intake of foods high in solid fats, added sugars, and salt. Get information about a proper diet from your caregiver, if  necessary.  Regular physical exercise is one of the most important things you can do for your health. Most adults should get at least 150 minutes of moderate-intensity exercise (any activity that increases your heart rate and causes you to sweat) each week. In addition, most adults need muscle-strengthening exercises on 2 or more days a week.   Maintain a healthy weight. The body mass index (BMI) is a screening tool to identify possible weight problems. It provides an estimate of body fat based on height and weight. Your caregiver can help determine your BMI, and can help you achieve or maintain a healthy weight. For adults 20 years and older:  A BMI below 18.5 is considered underweight.  A BMI of 18.5 to 24.9 is normal.  A BMI of 25 to 29.9 is considered overweight.  A BMI of 30 and above is considered obese.  Maintain normal blood lipids and cholesterol by exercising and minimizing your intake of saturated fat. Eat a balanced diet with plenty of fruits and vegetables. Blood tests for lipids and cholesterol should begin at age 20 and be repeated every 5 years. If your lipid or cholesterol levels are high, you are over 50, or you are a high risk for heart disease, you may need your cholesterol levels checked more frequently.Ongoing high lipid and cholesterol levels should be treated with medicines, if diet and exercise are not effective.  If you smoke, find out from your caregiver how to quit. If you do not use tobacco, do not start.  If you choose to drink alcohol, do not exceed 2 drinks per day. One drink is considered to be 12 ounces (355 mL) of beer, 5 ounces (148 mL) of wine, or 1.5 ounces (44 mL) of liquor.  Avoid use of street drugs. Do not share needles with anyone. Ask for help if you need support or instructions about stopping the use of drugs.  High blood pressure causes heart disease and increases the risk of stroke. Blood pressure should be checked at least every 1 to 2 years.  Ongoing high blood pressure should be treated with medicines if weight loss and exercise are not effective.  If you are 45 to 43 years old, ask your caregiver if you should take aspirin to prevent heart disease.  Diabetes screening involves taking a blood sample to check your fasting blood sugar level. This should be done once every 3 years, after age 45, if you are within normal weight and without risk factors for diabetes. Testing should be considered at a younger age or be carried out more frequently if you are overweight and have at least 1 risk factor for diabetes.  Colorectal cancer can be detected and often prevented. Most routine colorectal cancer screening begins at the age of 50 and continues through age 75. However, your caregiver may recommend screening at an earlier age if you have risk factors for colon cancer. On a yearly basis, your caregiver may provide home test kits to check for hidden blood in the stool. Use of a small camera at the end of a tube, to directly examine the colon (sigmoidoscopy or colonoscopy), can detect the earliest forms of colorectal   cancer. Talk to your caregiver about this at age 50, when routine screening begins. Direct examination of the colon should be repeated every 5 to 10 years through age 75, unless early forms of pre-cancerous polyps or small growths are found.  Hepatitis C blood testing is recommended for all people born from 1945 through 1965 and any individual with known risks for hepatitis C.  Healthy men should no longer receive prostate-specific antigen (PSA) blood tests as part of routine cancer screening. Consult with your caregiver about prostate cancer screening.  Testicular cancer screening is not recommended for adolescents or adult males who have no symptoms. Screening includes self-exam, caregiver exam, and other screening tests. Consult with your caregiver about any symptoms you have or any concerns you have about testicular  cancer.  Practice safe sex. Use condoms and avoid high-risk sexual practices to reduce the spread of sexually transmitted infections (STIs).  Use sunscreen with a sun protection factor (SPF) of 30 or greater. Apply sunscreen liberally and repeatedly throughout the day. You should seek shade when your shadow is shorter than you. Protect yourself by wearing long sleeves, pants, a wide-brimmed hat, and sunglasses year round, whenever you are outdoors.  Notify your caregiver of new moles or changes in moles, especially if there is a change in shape or color. Also notify your caregiver if a mole is larger than the size of a pencil eraser.  A one-time screening for abdominal aortic aneurysm (AAA) and surgical repair of large AAAs by sound wave imaging (ultrasonography) is recommended for ages 65 to 75 years who are current or former smokers.  Stay current with your immunizations. Document Released: 09/04/2007 Document Revised: 05/31/2011 Document Reviewed: 08/03/2010 ExitCare Patient Information 2014 [employer].  

## 2012-10-09 NOTE — Assessment & Plan Note (Signed)
He agrees to work on his lifestyle modifications 

## 2012-10-10 ENCOUNTER — Encounter: Payer: Self-pay | Admitting: Internal Medicine

## 2012-10-10 LAB — URINALYSIS, ROUTINE W REFLEX MICROSCOPIC
Bilirubin Urine: NEGATIVE
Nitrite: NEGATIVE
Total Protein, Urine: NEGATIVE

## 2012-10-25 ENCOUNTER — Ambulatory Visit: Payer: BC Managed Care – PPO | Admitting: Endocrinology

## 2012-10-30 ENCOUNTER — Ambulatory Visit: Payer: BC Managed Care – PPO | Admitting: Endocrinology

## 2012-11-14 ENCOUNTER — Ambulatory Visit: Payer: BC Managed Care – PPO | Admitting: Endocrinology

## 2012-11-27 ENCOUNTER — Ambulatory Visit: Payer: BC Managed Care – PPO | Admitting: Endocrinology

## 2013-01-01 ENCOUNTER — Ambulatory Visit: Payer: BC Managed Care – PPO | Admitting: Endocrinology

## 2013-01-25 ENCOUNTER — Other Ambulatory Visit: Payer: Self-pay

## 2013-02-05 ENCOUNTER — Ambulatory Visit: Payer: BC Managed Care – PPO | Admitting: Endocrinology

## 2013-02-05 DIAGNOSIS — Z0289 Encounter for other administrative examinations: Secondary | ICD-10-CM

## 2013-03-13 ENCOUNTER — Ambulatory Visit: Payer: BC Managed Care – PPO | Admitting: Endocrinology

## 2013-04-03 ENCOUNTER — Ambulatory Visit (INDEPENDENT_AMBULATORY_CARE_PROVIDER_SITE_OTHER): Payer: BC Managed Care – PPO | Admitting: Endocrinology

## 2013-04-03 ENCOUNTER — Encounter: Payer: Self-pay | Admitting: Endocrinology

## 2013-04-03 VITALS — BP 126/84 | HR 77 | Temp 98.2°F | Resp 16 | Ht 68.75 in | Wt >= 6400 oz

## 2013-04-03 DIAGNOSIS — I10 Essential (primary) hypertension: Secondary | ICD-10-CM

## 2013-04-03 DIAGNOSIS — E269 Hyperaldosteronism, unspecified: Secondary | ICD-10-CM

## 2013-04-03 LAB — BASIC METABOLIC PANEL
BUN: 14 mg/dL (ref 6–23)
CALCIUM: 9.1 mg/dL (ref 8.4–10.5)
CHLORIDE: 106 meq/L (ref 96–112)
CO2: 24 meq/L (ref 19–32)
CREATININE: 1.1 mg/dL (ref 0.4–1.5)
GFR: 94.59 mL/min (ref >60.00)
GLUCOSE: 93 mg/dL (ref 70–99)
Potassium: 4 mEq/L (ref 3.5–5.1)
Sodium: 138 mEq/L (ref 135–145)

## 2013-04-03 NOTE — Progress Notes (Signed)
Subjective:    Patient ID: Kenneth Barrett, male    DOB: 1970-03-28, 43 y.o.   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 He was having nonspecific symptoms and blood pressure was markedly increased at over 200 systolic and about 110-120 diastolic He thinks he also was told to have low potassium levels at that time but no details are available He was treated with various antihypertensive regimens and eventually was [place] who apparently did a number of tests including 24 hour urine collections. Again these records are not available Not clear if he had a adrenal adenoma diagnosed but he was treated with spironolactone, potassium, enalapril, HCTZ and amlodipine for several years  During an admission for dehydration secondary to gastroenteritis and possible Sepsis he had developed significant hypotension and his medication regimen was changed. Since then he has been only taking atenolol and amlodipine without any spironolactone or potassium supplements.  His last potassium level was normal in 7/14 Is now referred here for followup of his hyperaldosteronism    Medication List       This list is accurate as of: 04/03/13  4:05 PM.  Always use your most recent med list.               amLODipine 10 MG tablet  Commonly known as:  NORVASC  Take 10 mg by mouth Daily.     atenolol 50 MG tablet  Commonly known as:  TENORMIN  Take 50 mg by mouth Daily.     FLUoxetine 10 MG capsule  Commonly known as:  PROZAC  Take 10 mg by mouth daily.       No Known Allergies  Past Medical History  Diagnosis Date  . Hypertension   . Gum disease   . Hyperaldosteronism   . Sleep apnea   . Adrenal gland disorder   . Alpha thalassemia major 2010    No past surgical history on file.  History   Social History  . Marital Status: Single    Spouse Name: N/A    Number  of Children: N/A  . Years of Education: N/A   Occupational History  . Not on file.   Social History Main Topics  . Smoking status: Former Smoker    Types: Cigarettes    Quit date: 08/05/2010  . Smokeless tobacco: Never Used  . Alcohol Use: Yes     Comment: 3 beers per month  . Drug Use: No  . Sexual Activity: No   Other Topics Concern  . Not on file   Social History Narrative  . No narrative on file     Review of Systems  Constitutional:       He has marked obesity for several years. Has gained 20 pounds in the last few months. Has not been watching his diet or exercising recently. Has not seen Dietician. He has considered bariatric surgery but has not followed up because of cost  HENT: Negative for congestion.   Eyes: Negative for visual disturbance.  Respiratory: Positive for shortness of breath. Negative for cough.        Shortness of breath only With Moderate activity Has been on CPAP for sleep apnea since 2005, using regularly   Cardiovascular: Positive for leg swelling.       Only on sitting for long time  Gastrointestinal: Negative for nausea and abdominal pain.  Endocrine: Negative for  cold intolerance and polydipsia.  Skin:       No unusual pigmentation  Neurological: Negative for numbness and headaches.  Psychiatric/Behavioral:       On Prozac for mild depression    Wt Readings from Last 3 Encounters:  04/03/13 456 lb (206.84 kg)  10/09/12 436 lb (197.768 kg)  05/17/12 430 lb 9.6 oz (195.319 kg)      Objective:   Physical Exam  Constitutional: He is oriented to person, place, and time.  He has marked generalized morbid obesity present. No cushingoid features  HENT:  Mouth/Throat: Oropharynx is clear and moist.  Eyes: Pupils are equal, round, and reactive to light.  Fundi normal. No significant arteriolar changes seen  Neck: No thyromegaly present.  Cardiovascular: Regular rhythm and normal heart sounds.   No murmur heard. Pulmonary/Chest: Breath  sounds normal. He has no wheezes. He has no rales.  Abdominal: He exhibits no distension. There is no tenderness.  No hepatosplenomegaly by percussion/palpation, difficult to examine because of marked obesity. No abdominal bruit present  Musculoskeletal: He exhibits no edema.  Lymphadenopathy:    He has no cervical adenopathy.  Neurological: He is alert and oriented to person, place, and time. He displays normal reflexes.  Skin: Skin is warm and dry. No rash noted.  Minimal pigmentation of neck area. No caf au lait spots          Assessment & Plan:   History of hyperaldosteronism: This apparently has been evaluated several years ago but no records are available Currently appears to have good control of blood pressure with only amlodipine and atenolol Also has not had a recurrence of his hypokalemia since his admission to the hospital in 1/14 Not clear if he still has active hyperaldosteronism. For now will start evaluating with repeat potassium and renal function today and if potassium is low we'll proceed to evaluate been in aldosterone ratio and 24 urine potassium  If his potassium is normal he can continue followup with PCP   [facility] 04/03/2013   Addendum: His potassium level is quite normal along with normal renal function. Do not think he has any active hyperaldosteronism at this time and can continue followup with PCP

## 2013-04-03 NOTE — Progress Notes (Signed)
Quick Note:  Please let patient know that the lab result is normal and no further action needed, followup with PCP ______

## 2013-04-06 IMAGING — US US RENAL PORT
1 series · 14 of 25 positions shown · non-contrast
Comparison: None.

CLINICAL DATA: Renal failure, evaluate for hydro

RENAL/URINARY TRACT ULTRASOUND COMPLETE

[Series 1: us renal port · 0.40mm/px · 14 of 50 slices shown]
[im 1/50]
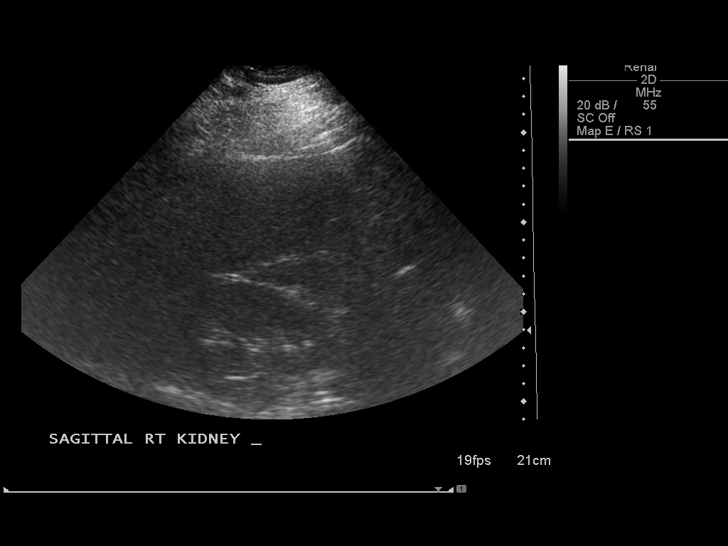
[im 5/50]
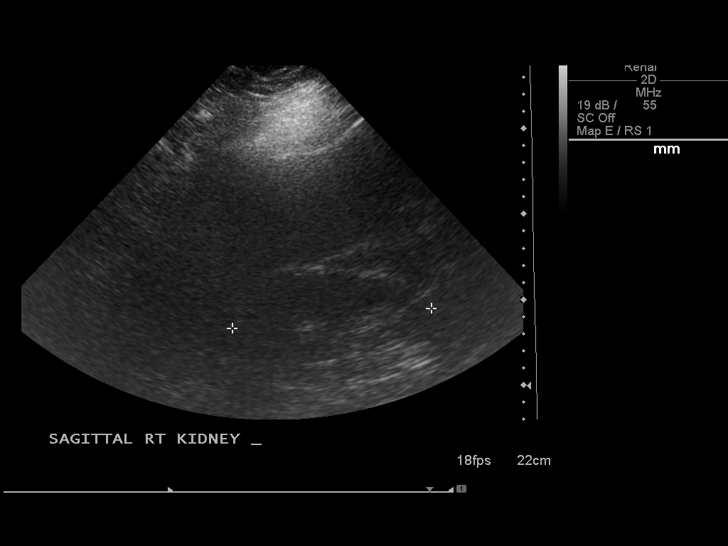
[im 9/50]
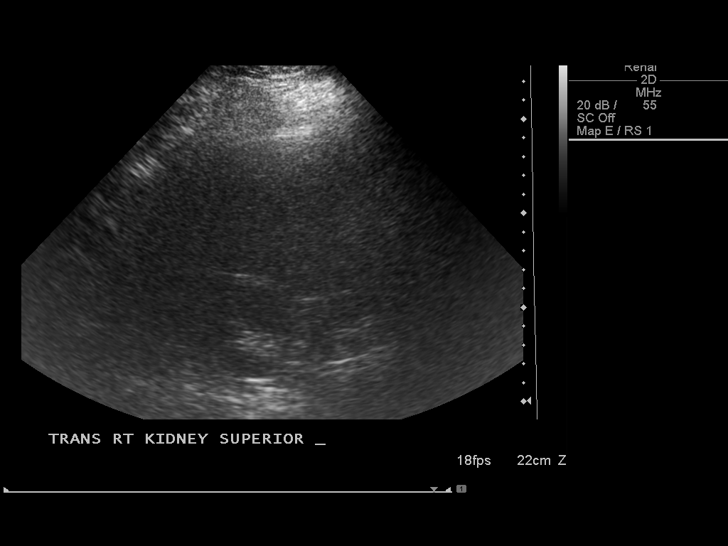
[im 13/50]
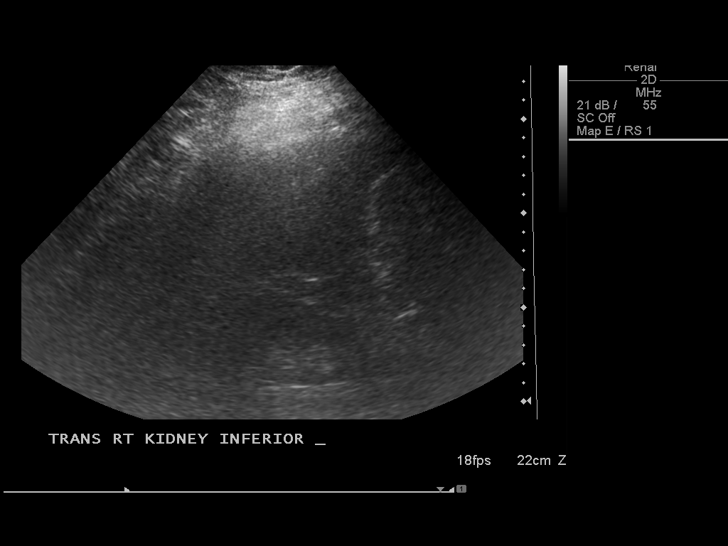
[im 17/50]
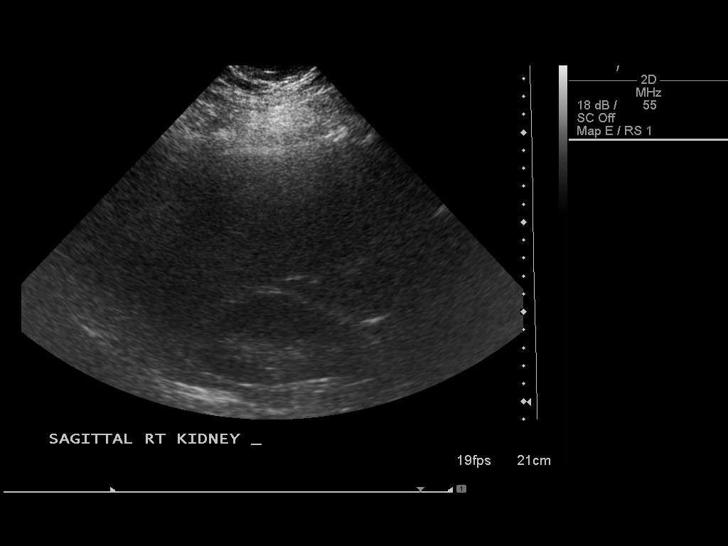
[im 19/50]
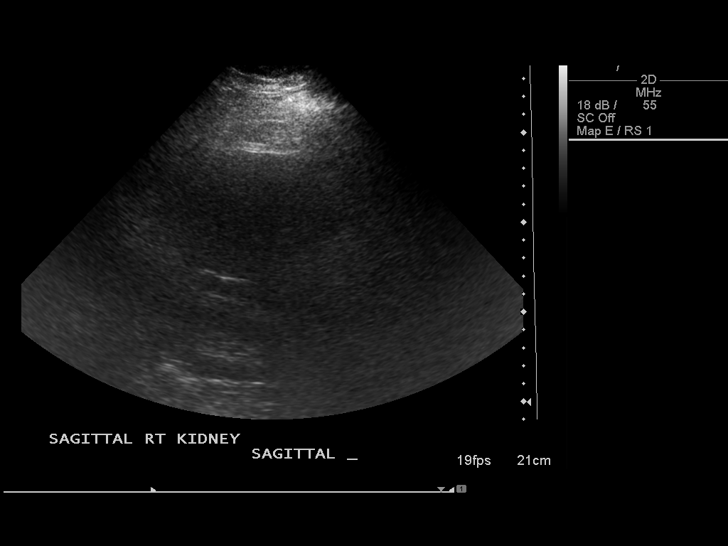
[im 23/50]
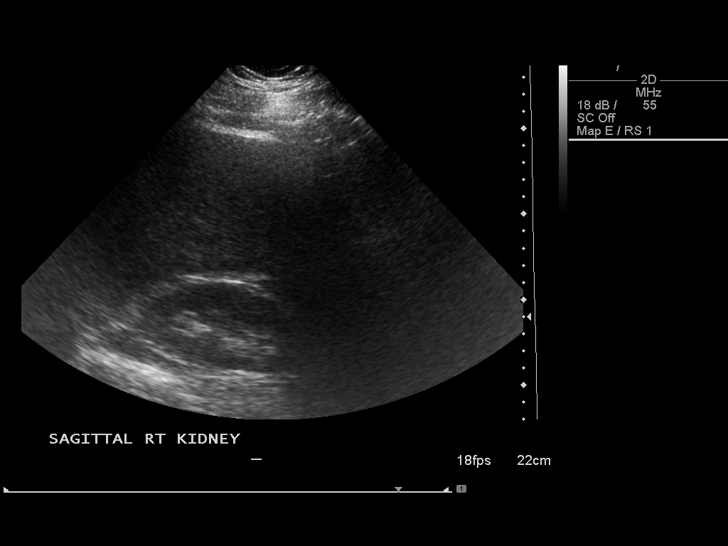
[im 27/50]
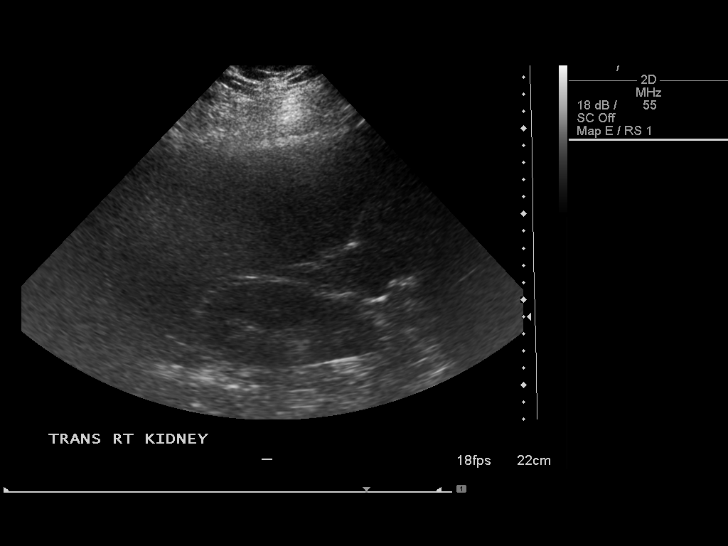
[im 31/50]
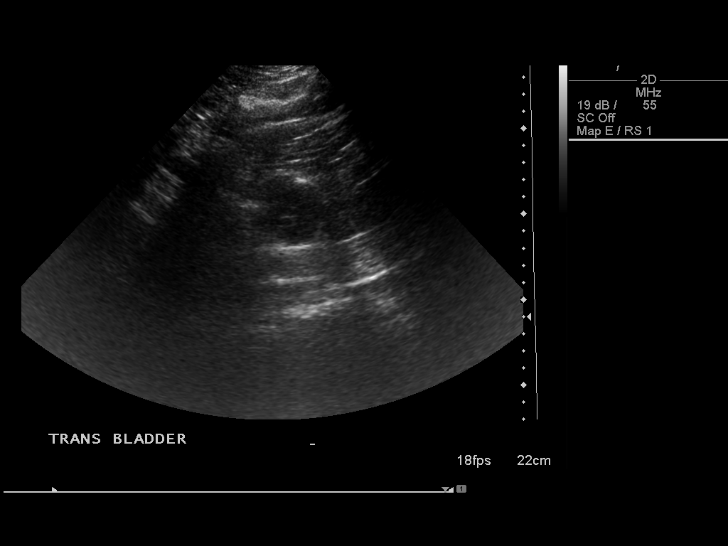
[im 33/50]
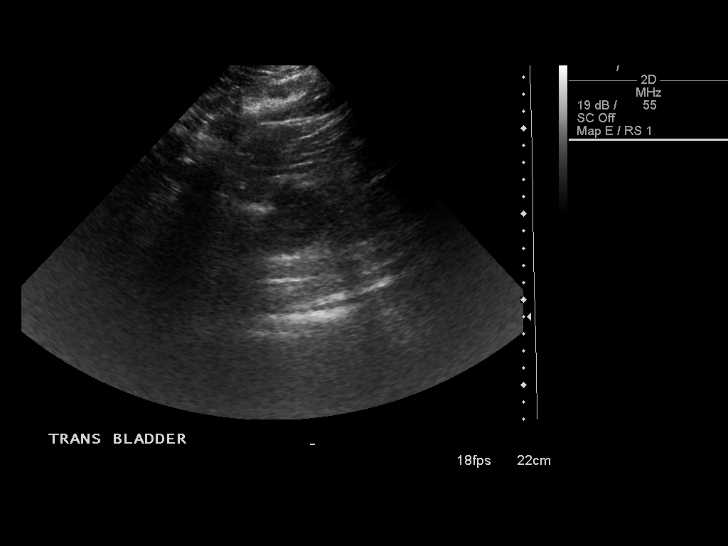
[im 37/50]
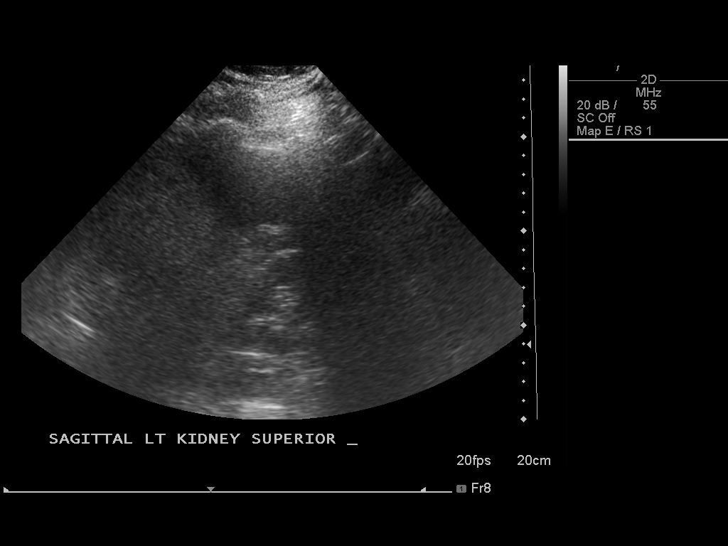
[im 41/50]
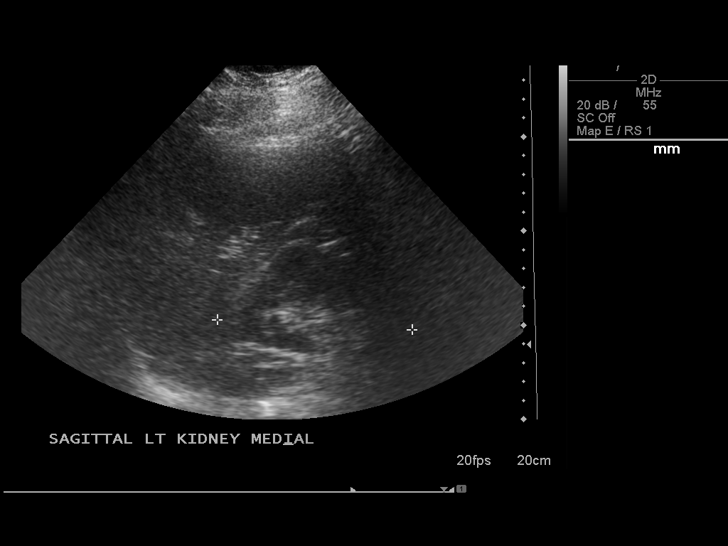
[im 45/50]
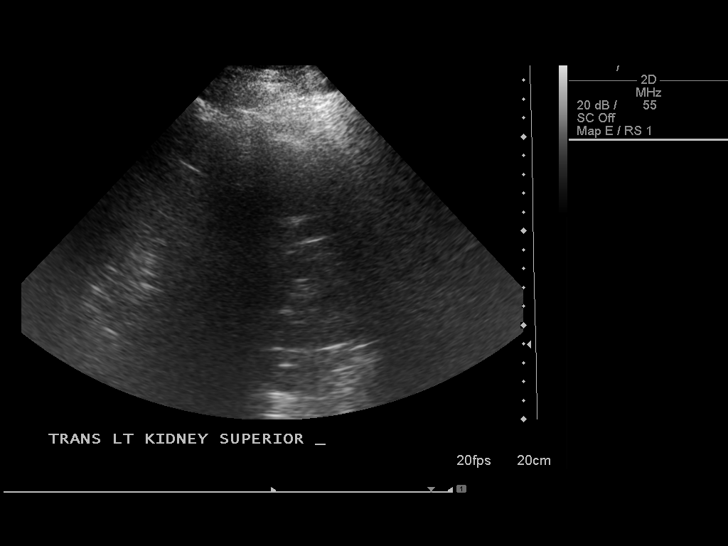
[im 50/50]
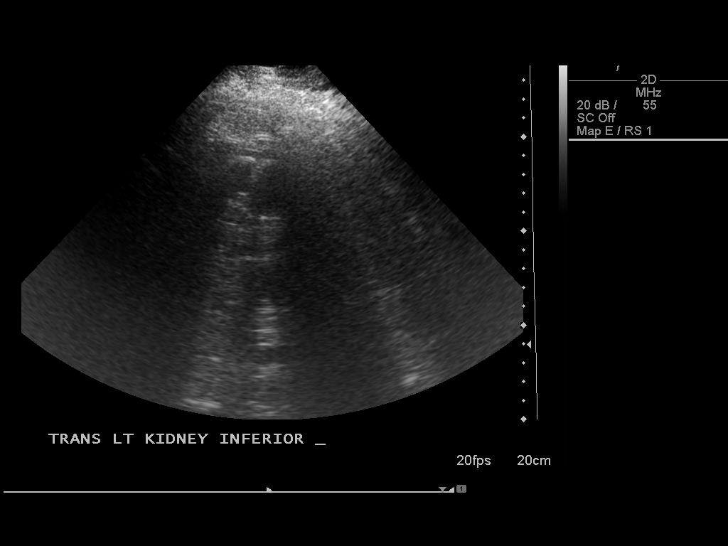

[14 of 25 positions shown; findings below may reference images not displayed]

FINDINGS: Right Kidney:  Measures 11.7 cm.  Poorly visualized due to body
habitus.  No gross hydronephrosis.

Left Kidney:  Measures 10.4 cm.  Poorly visualized due to body
habitus.  No gross hydronephrosis.

Bladder:  Decompressed by indwelling Foley catheter.
IMPRESSION: No gross hydronephrosis.

## 2013-04-06 IMAGING — CR DG CHEST 1V PORT
1 series · 2 of 2 positions shown · non-contrast
Comparison: 06/29/2011

CLINICAL DATA: Posterior chest pain.  Back pain.

PORTABLE CHEST - 1 VIEW

[Series 1: AP · U · 2 of 2 slices shown]
[im 1/2]
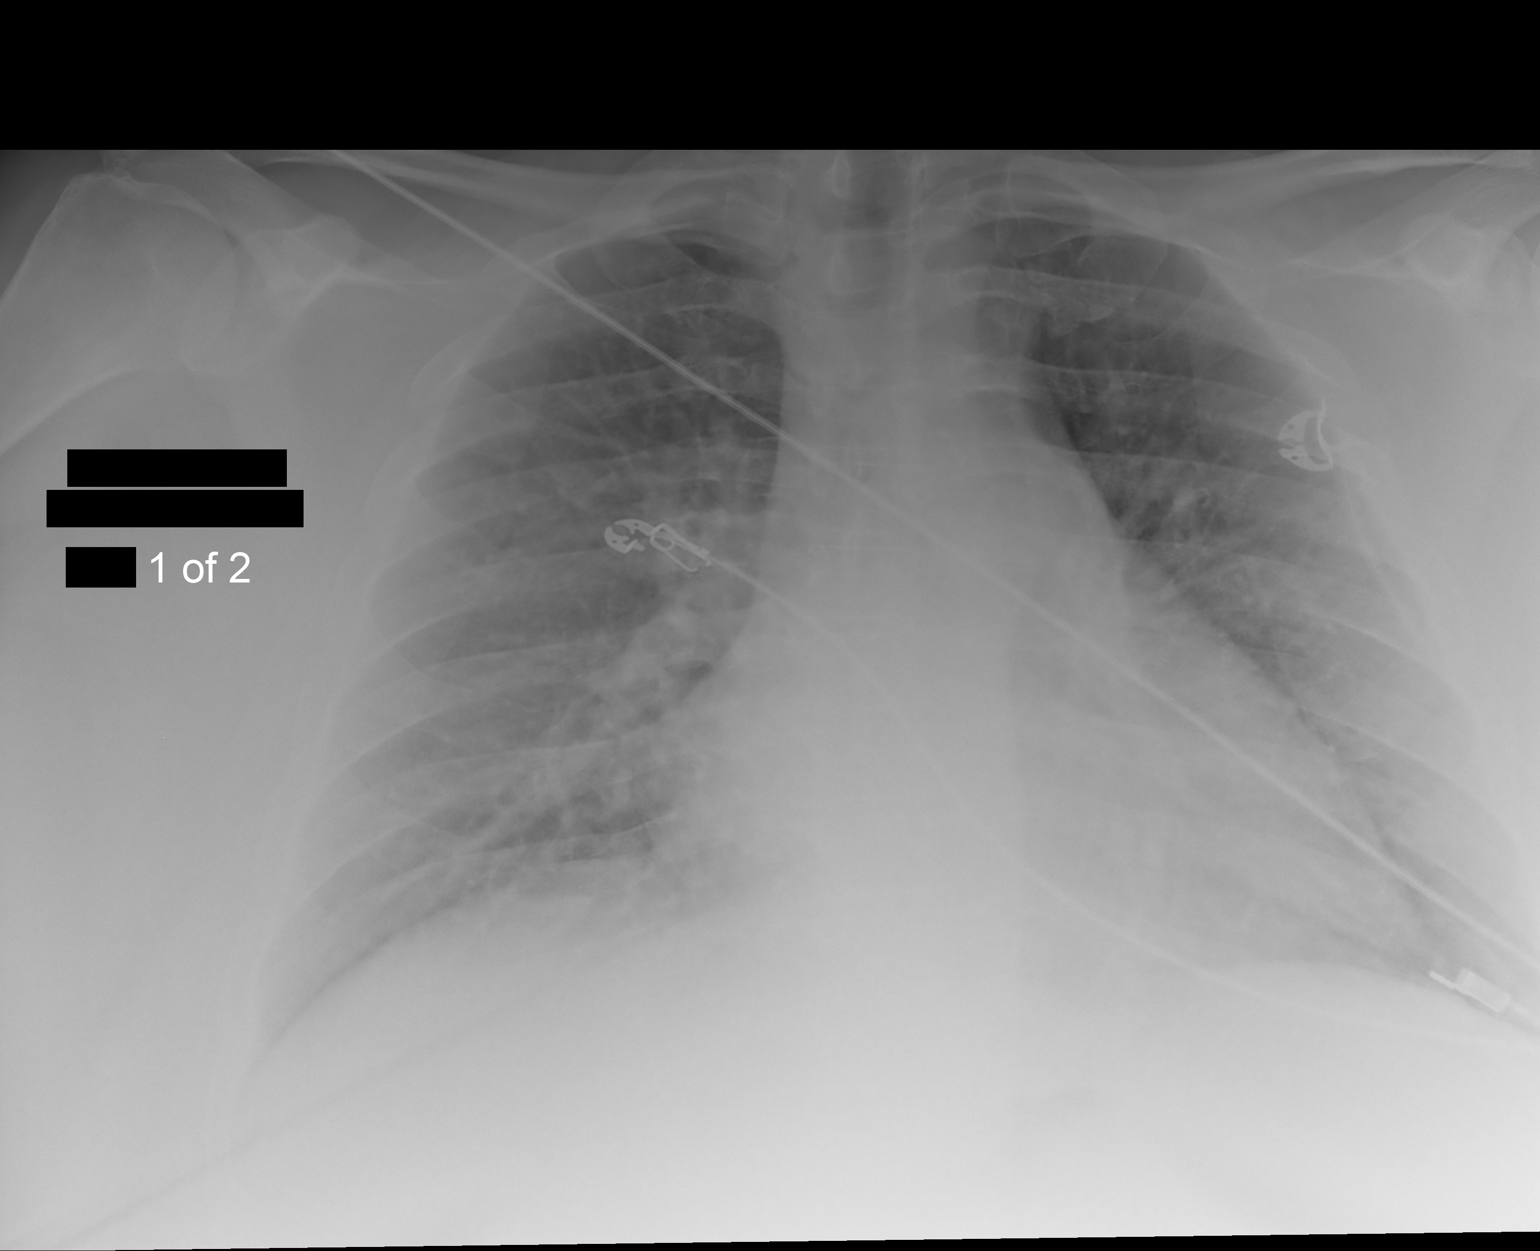
[im 2/2]
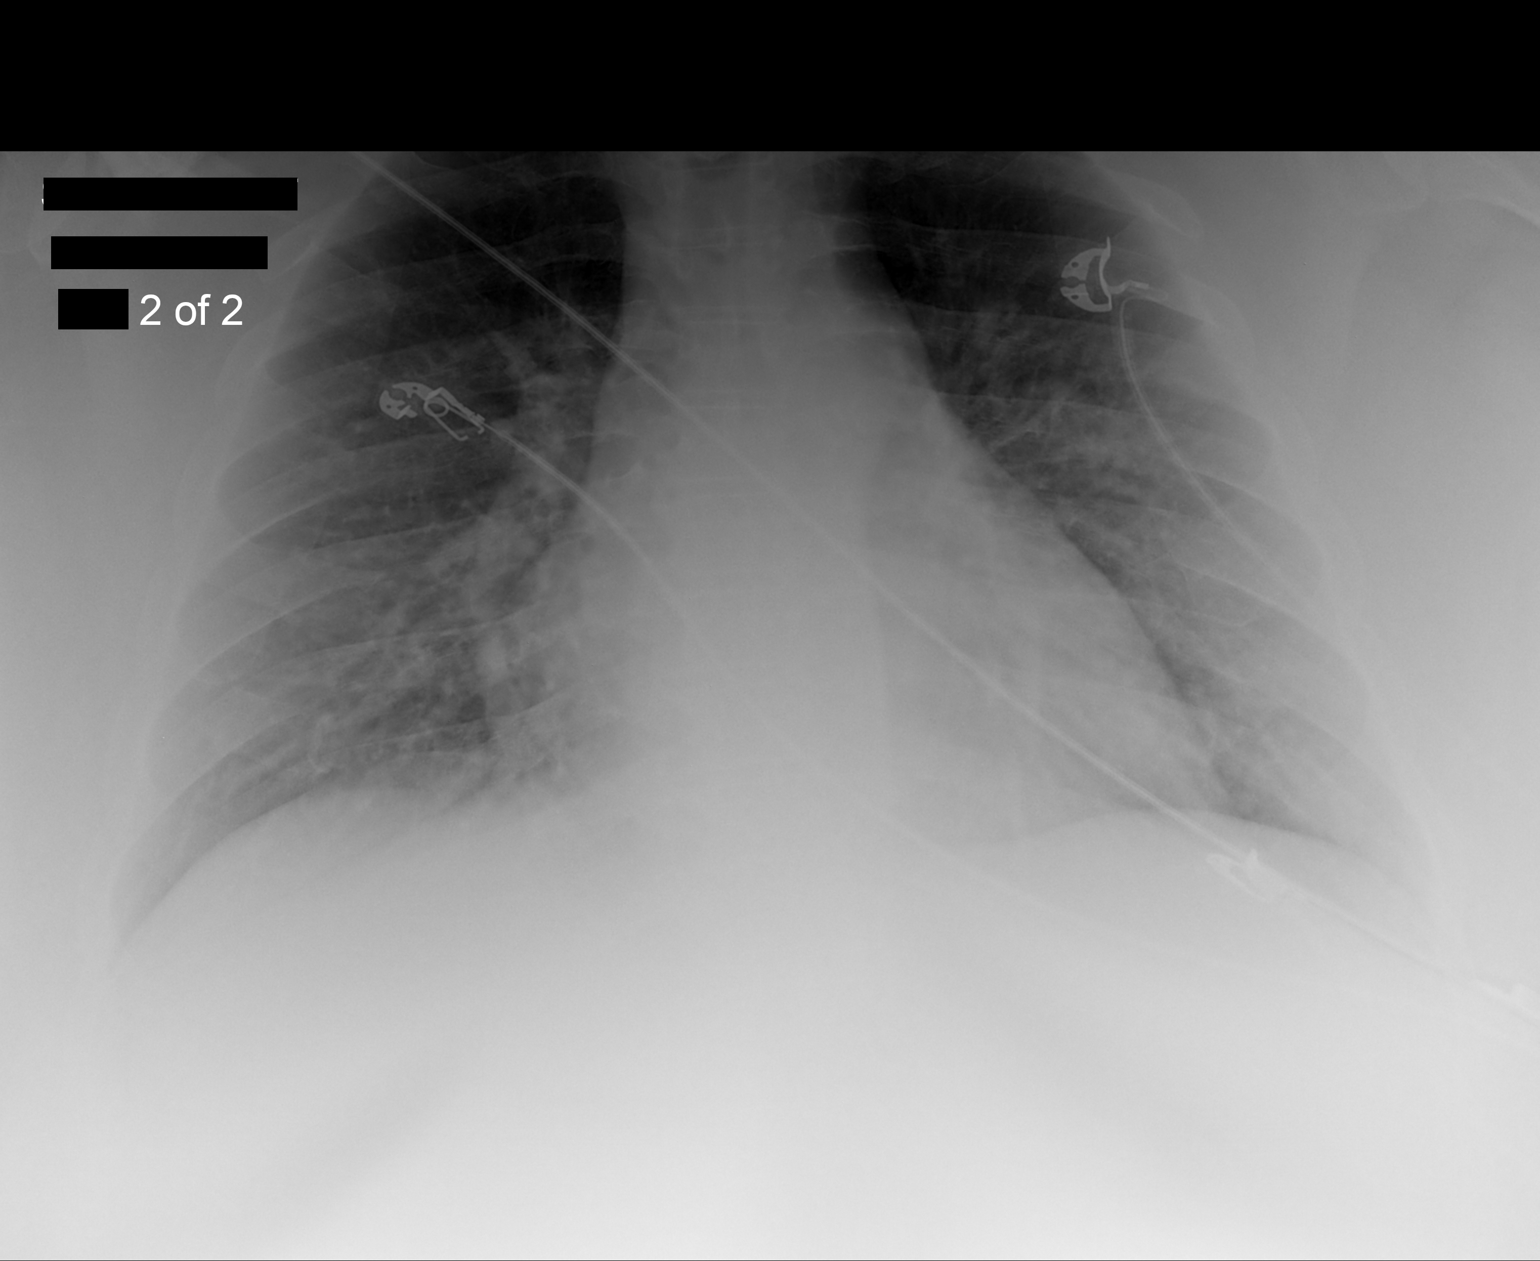

[2 of 2 positions shown; findings below may reference images not displayed]

FINDINGS: Heart size and pulmonary vascularity are normal and the
lungs are clear.  No acute osseous abnormality.
IMPRESSION: No acute disease.

## 2013-04-09 IMAGING — CR DG ABDOMEN 1V
1 series · 2 of 2 positions shown · non-contrast
Comparison: 06/29/2011

CLINICAL DATA: Weakness, nausea, vomiting

ABDOMEN - 1 VIEW

[Series 1: PA · right · 2 of 2 slices shown]
[im 1/2]
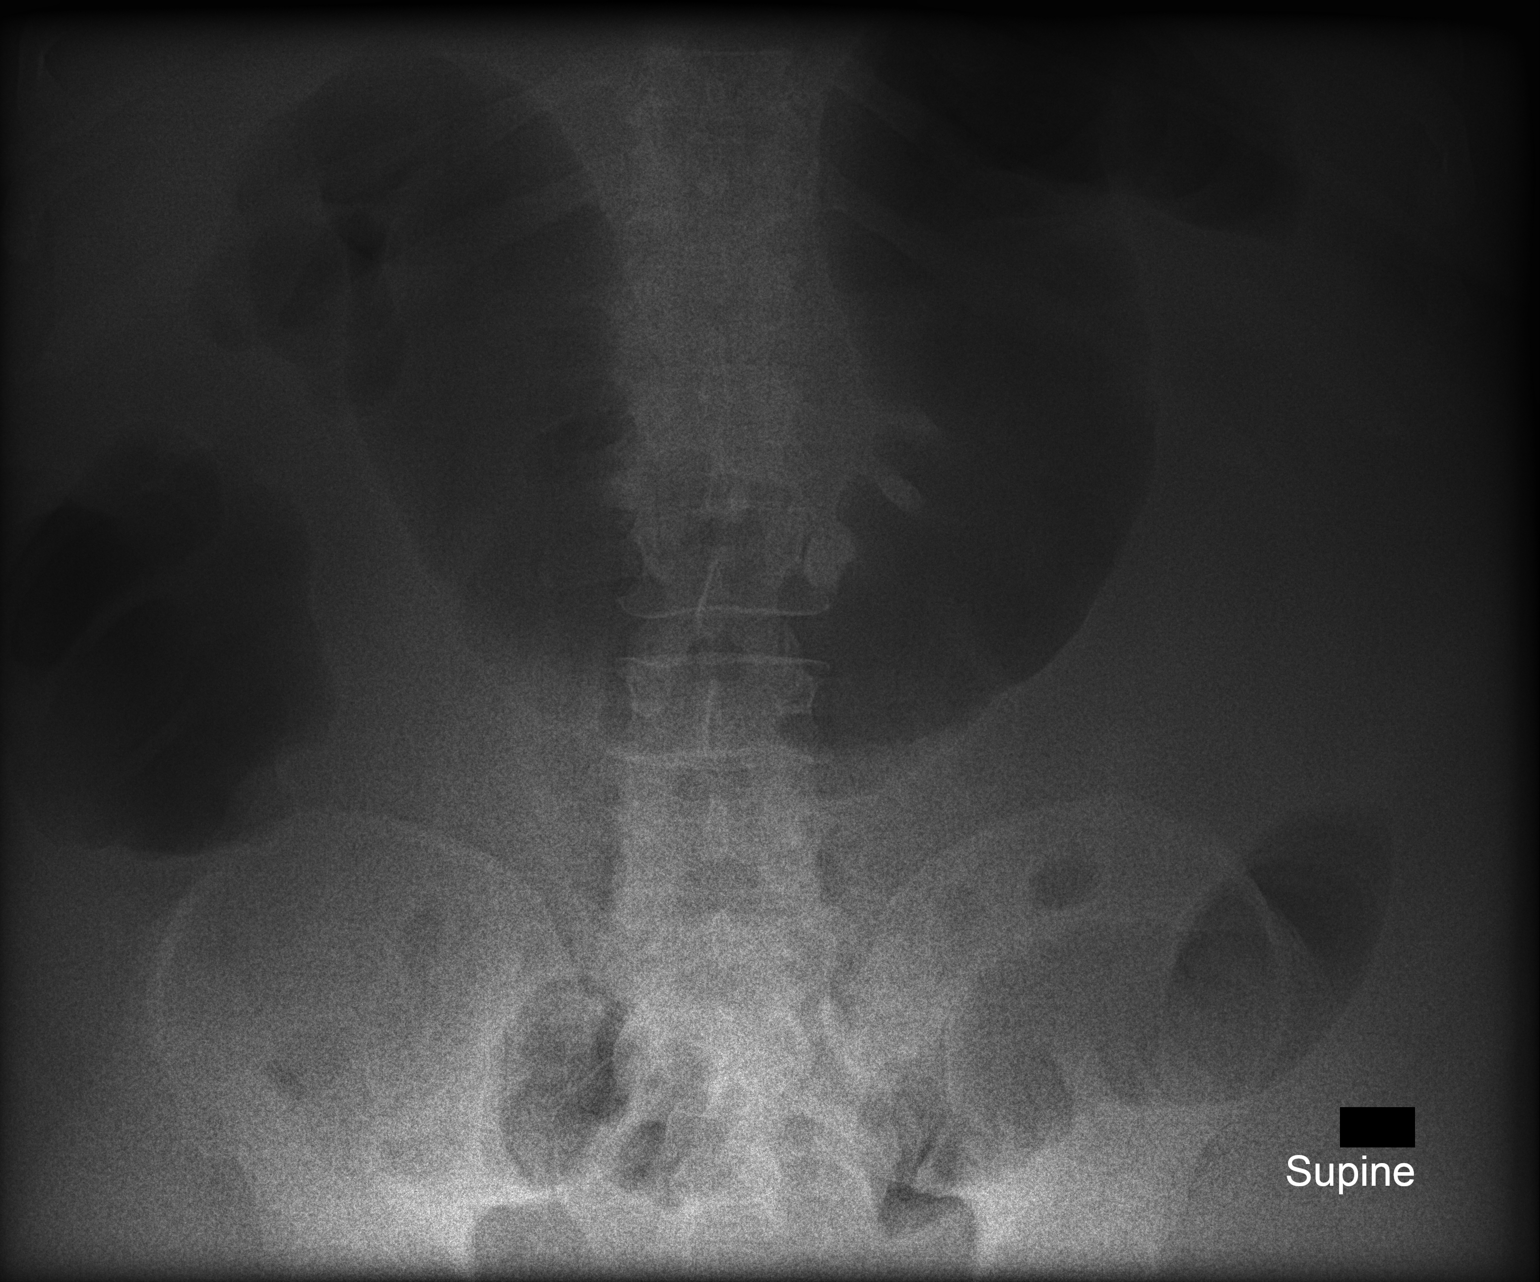
[im 2/2]
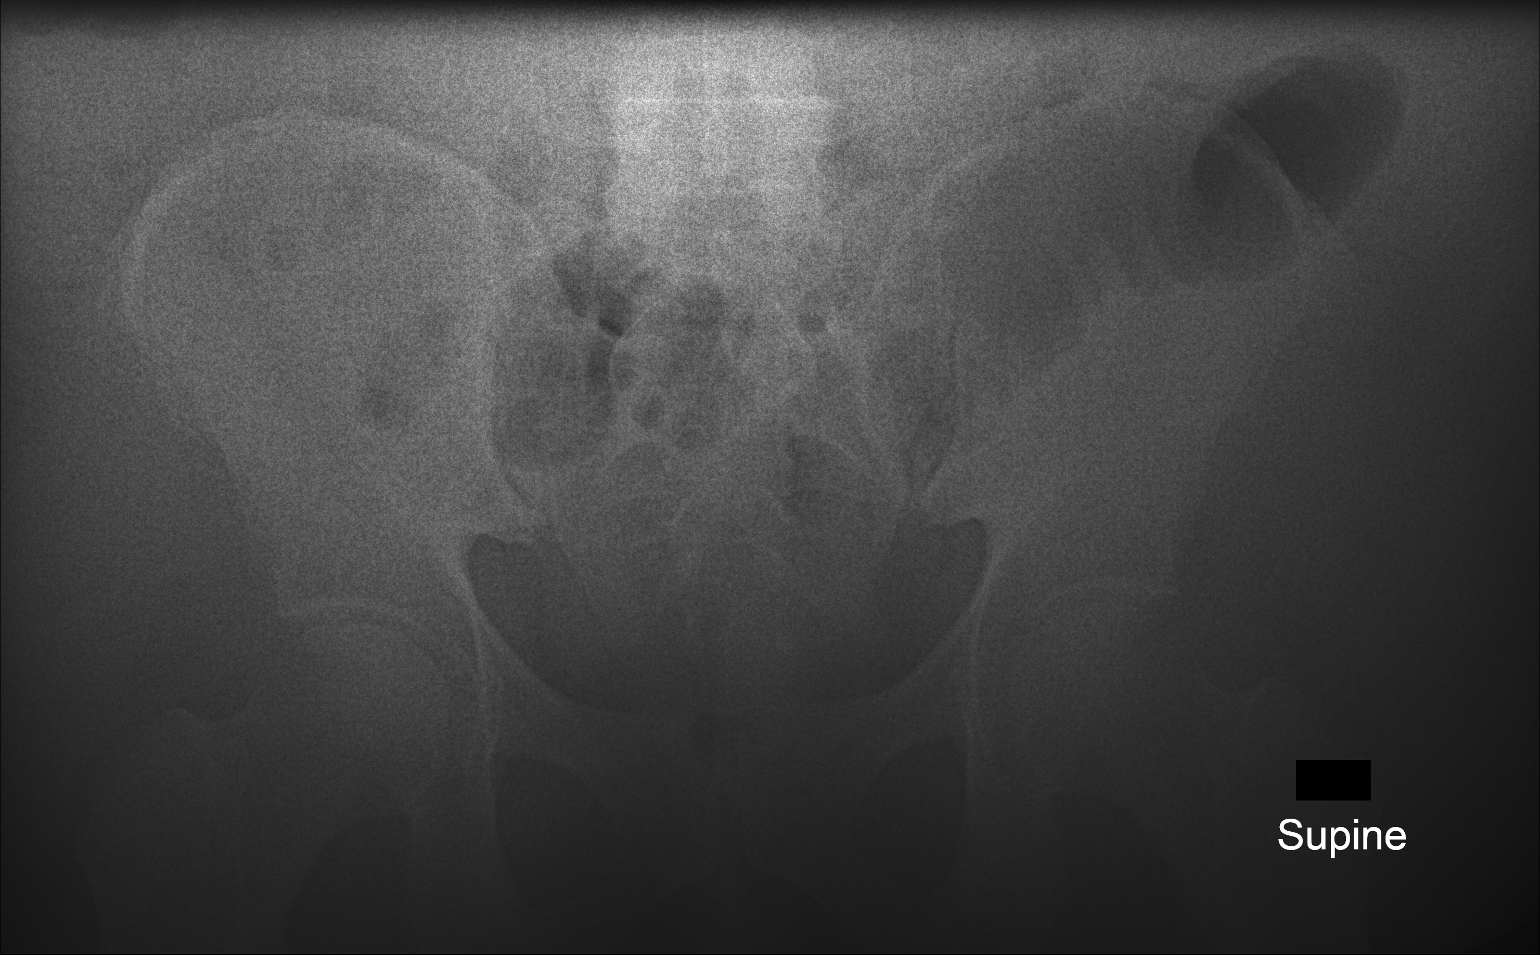

[2 of 2 positions shown; findings below may reference images not displayed]

FINDINGS: Gaseous distention of transverse colon up to 8.1 cm diameter.
Cecum is excluded laterally.
Small amount of gas within sigmoid colon, which is upper normal
caliber.
No definite bowel wall thickening or evidence of obstruction.
No small bowel dilatation.
Bones appear demineralized.
IMPRESSION: Mild gaseous distention of transverse colon up to 8.1 cm diameter.

## 2013-04-10 ENCOUNTER — Encounter: Payer: Self-pay | Admitting: *Deleted

## 2013-05-23 ENCOUNTER — Other Ambulatory Visit: Payer: Self-pay | Admitting: Internal Medicine

## 2013-05-24 ENCOUNTER — Other Ambulatory Visit: Payer: Self-pay | Admitting: Internal Medicine

## 2013-08-01 ENCOUNTER — Telehealth: Payer: Self-pay | Admitting: Internal Medicine

## 2013-08-01 NOTE — Telephone Encounter (Signed)
Patient left a VM on the triage line to request a 3 month supply of the following medications to be sent to [facility]:  amLODipine (NORVASC) 10 MG tablet atenolol (TENORMIN) 50 MG tablet FLUoxetine (PROZAC) 10 MG capsule

## 2013-08-02 MED ORDER — AMLODIPINE BESYLATE 10 MG PO TABS: ORAL_TABLET | ORAL | Status: DC

## 2013-08-02 MED ORDER — ATENOLOL 50 MG PO TABS: ORAL_TABLET | ORAL | Status: DC

## 2013-08-02 MED ORDER — FLUOXETINE HCL 10 MG PO CAPS: 10.0000 mg | ORAL_CAPSULE | Freq: Every day | ORAL | Status: DC

## 2013-08-02 NOTE — Telephone Encounter (Signed)
RX  Sent per pt request

## 2013-09-03 ENCOUNTER — Other Ambulatory Visit: Payer: Self-pay | Admitting: Internal Medicine

## 2013-10-04 ENCOUNTER — Other Ambulatory Visit: Payer: Self-pay | Admitting: Internal Medicine

## 2014-02-18 ENCOUNTER — Other Ambulatory Visit: Payer: Self-pay | Admitting: *Deleted

## 2014-02-19 MED ORDER — ATENOLOL 50 MG PO TABS: ORAL_TABLET | ORAL | Status: DC

## 2014-02-19 MED ORDER — FLUOXETINE HCL 10 MG PO CAPS: 10.0000 mg | ORAL_CAPSULE | Freq: Every day | ORAL | Status: DC

## 2014-03-09 ENCOUNTER — Other Ambulatory Visit: Payer: Self-pay | Admitting: Internal Medicine

## 2014-04-17 ENCOUNTER — Telehealth: Payer: Self-pay | Admitting: Internal Medicine

## 2014-04-17 NOTE — Telephone Encounter (Signed)
Pt needs the following meds to go to [facility] [phone] fax   [phone] phone    amLODipine (NORVASC) 10 MG tablet atenolol (TENORMIN) 50 MG tablet FLUoxetine (PROZAC) 10 MG capsule    Order number [id number]

## 2014-04-17 NOTE — Telephone Encounter (Signed)
Pt need appt last ov 09/2012...Raechel Chute/lmb

## 2014-04-17 NOTE — Telephone Encounter (Signed)
Called pt left message on cell to call back an make appt

## 2014-04-18 ENCOUNTER — Other Ambulatory Visit (INDEPENDENT_AMBULATORY_CARE_PROVIDER_SITE_OTHER): Payer: 59

## 2014-04-18 ENCOUNTER — Ambulatory Visit (INDEPENDENT_AMBULATORY_CARE_PROVIDER_SITE_OTHER): Payer: 59 | Admitting: Internal Medicine

## 2014-04-18 ENCOUNTER — Encounter: Payer: Self-pay | Admitting: Internal Medicine

## 2014-04-18 VITALS — BP 110/64 | HR 68 | Temp 97.8°F | Resp 16 | Ht 68.75 in | Wt >= 6400 oz

## 2014-04-18 DIAGNOSIS — I4819 Other persistent atrial fibrillation: Secondary | ICD-10-CM

## 2014-04-18 DIAGNOSIS — I481 Persistent atrial fibrillation: Secondary | ICD-10-CM

## 2014-04-18 DIAGNOSIS — E269 Hyperaldosteronism, unspecified: Secondary | ICD-10-CM

## 2014-04-18 DIAGNOSIS — I10 Essential (primary) hypertension: Secondary | ICD-10-CM

## 2014-04-18 DIAGNOSIS — R739 Hyperglycemia, unspecified: Secondary | ICD-10-CM

## 2014-04-18 DIAGNOSIS — R0600 Dyspnea, unspecified: Secondary | ICD-10-CM

## 2014-04-18 DIAGNOSIS — Z9989 Dependence on other enabling machines and devices: Secondary | ICD-10-CM

## 2014-04-18 DIAGNOSIS — G4733 Obstructive sleep apnea (adult) (pediatric): Secondary | ICD-10-CM

## 2014-04-18 LAB — CBC WITH DIFFERENTIAL/PLATELET
Basophils Absolute: 0 10*3/uL (ref 0.0–0.1)
Basophils Relative: 0.3 % (ref 0.0–3.0)
Eosinophils Absolute: 0 10*3/uL (ref 0.0–0.7)
Eosinophils Relative: 0.6 % (ref 0.0–5.0)
HCT: 36.5 % — ABNORMAL LOW (ref 39.0–52.0)
Hemoglobin: 12.1 g/dL — ABNORMAL LOW (ref 13.0–17.0)
Lymphocytes Relative: 14.4 % (ref 12.0–46.0)
Lymphs Abs: 1.2 10*3/uL (ref 0.7–4.0)
MCHC: 33.3 g/dL (ref 30.0–36.0)
MCV: 64.5 fl — ABNORMAL LOW (ref 78.0–100.0)
Monocytes Absolute: 0.6 10*3/uL (ref 0.1–1.0)
Monocytes Relative: 7 % (ref 3.0–12.0)
Neutro Abs: 6.4 10*3/uL (ref 1.4–7.7)
Neutrophils Relative %: 77.7 % — ABNORMAL HIGH (ref 43.0–77.0)
Platelets: 171 10*3/uL (ref 150.0–400.0)
RBC: 5.66 Mil/uL (ref 4.22–5.81)
RDW: 22.3 % — ABNORMAL HIGH (ref 11.5–15.5)
WBC: 8.3 10*3/uL (ref 4.0–10.5)

## 2014-04-18 LAB — COMPREHENSIVE METABOLIC PANEL
ALT: 22 U/L (ref 0–53)
AST: 17 U/L (ref 0–37)
Albumin: 4 g/dL (ref 3.5–5.2)
Alkaline Phosphatase: 73 U/L (ref 39–117)
BUN: 13 mg/dL (ref 6–23)
CO2: 23 mEq/L (ref 19–32)
Calcium: 8.9 mg/dL (ref 8.4–10.5)
Chloride: 105 mEq/L (ref 96–112)
Creatinine, Ser: 1.05 mg/dL (ref 0.40–1.50)
GFR: 98.29 mL/min (ref >60.00)
Glucose, Bld: 96 mg/dL (ref 70–99)
Potassium: 4 mEq/L (ref 3.5–5.1)
SODIUM: 137 meq/L (ref 135–145)
TOTAL PROTEIN: 7.6 g/dL (ref 6.0–8.3)
Total Bilirubin: 0.6 mg/dL (ref 0.2–1.2)

## 2014-04-18 LAB — TROPONIN I: TNIDX: 0 ug/L (ref 0.00–0.06)

## 2014-04-18 LAB — HEMOGLOBIN A1C: Hgb A1c MFr Bld: 5.4 % (ref 4.6–6.5)

## 2014-04-18 LAB — BRAIN NATRIURETIC PEPTIDE: Pro B Natriuretic peptide (BNP): 267 pg/mL — ABNORMAL HIGH (ref 0.0–100.0)

## 2014-04-18 MED ORDER — RIVAROXABAN 20 MG PO TABS: 20.0000 mg | ORAL_TABLET | Freq: Every day | ORAL | Status: AC

## 2014-04-18 MED ORDER — RIVAROXABAN 20 MG PO TABS: 20.0000 mg | ORAL_TABLET | Freq: Every day | ORAL | Status: DC

## 2014-04-18 NOTE — Patient Instructions (Signed)

## 2014-04-18 NOTE — Progress Notes (Signed)
Subjective:    Patient ID: Kenneth Barrett, male    DOB: 1970-05-11, 44 y.o.   MRN: [id number]  HPI Comments: He complains of fatigue and DOE for 3 months.  Hypertension This is a chronic problem. The current episode started more than 1 year ago. The problem is unchanged. The problem is controlled. Associated symptoms include malaise/fatigue and peripheral edema. Pertinent negatives include no anxiety, blurred vision, chest pain, headaches, palpitations, shortness of breath or sweats. There are no associated agents to hypertension. Risk factors for coronary artery disease include obesity and sedentary lifestyle. Past treatments include beta blockers and calcium channel blockers. The current treatment provides significant improvement. Compliance problems include diet and exercise.  Identifiable causes of hypertension include hyperaldosteronism and sleep apnea.      Review of Systems  Constitutional: Positive for malaise/fatigue, fatigue and unexpected weight change (wt gain). Negative for fever, chills, diaphoresis, activity change and appetite change.  HENT: Negative.   Eyes: Negative.  Negative for blurred vision.  Respiratory: Positive for apnea. Negative for cough, choking, chest tightness, shortness of breath and stridor.   Cardiovascular: Negative.  Negative for chest pain, palpitations and leg swelling.  Gastrointestinal: Negative.  Negative for nausea, vomiting, abdominal pain, diarrhea, constipation and blood in stool.  Endocrine: Negative.   Genitourinary: Negative.  Negative for frequency, hematuria and difficulty urinating.  Musculoskeletal: Negative.  Negative for myalgias, back pain, joint swelling and arthralgias.  Skin: Negative.   Allergic/Immunologic: Negative.   Neurological: Negative.  Negative for dizziness, tremors, speech difficulty, weakness, light-headedness, numbness and headaches.  Hematological: Negative.  Negative for adenopathy. Does not bruise/bleed easily.    Psychiatric/Behavioral: Negative.        Objective:   Physical Exam  Constitutional: He is oriented to person, place, and time. He appears well-developed and well-nourished.  Non-toxic appearance. He does not have a sickly appearance. He does not appear ill. No distress.  HENT:  Head: Normocephalic and atraumatic.  Mouth/Throat: Oropharynx is clear and moist. No oropharyngeal exudate.  Eyes: Conjunctivae are normal. Right eye exhibits no discharge. Left eye exhibits no discharge. No scleral icterus.  Neck: Normal range of motion. Neck supple. No JVD present. No tracheal deviation present. No thyromegaly present.  Cardiovascular: Normal heart sounds and intact distal pulses.  An irregularly irregular rhythm present. Bradycardia present.  Exam reveals no gallop and no friction rub.   No murmur heard. Pulses:      Carotid pulses are 1+ on the right side, and 1+ on the left side.      Radial pulses are 1+ on the right side, and 1+ on the left side.       Femoral pulses are 1+ on the right side, and 1+ on the left side.      Popliteal pulses are 1+ on the right side, and 1+ on the left side.       Dorsalis pedis pulses are 1+ on the right side, and 1+ on the left side.       Posterior tibial pulses are 1+ on the right side, and 1+ on the left side.  Pulmonary/Chest: Effort normal and breath sounds normal. No accessory muscle usage or stridor. No tachypnea. No respiratory distress. He has no decreased breath sounds. He has no wheezes. He has no rhonchi. He has no rales. He exhibits no tenderness.  Abdominal: Soft. Bowel sounds are normal. He exhibits no distension. There is no tenderness. There is no rebound and no guarding.  Musculoskeletal: Normal range of  motion. He exhibits edema (2+ non-pitting edema in BLE). He exhibits no tenderness.  Lymphadenopathy:    He has no cervical adenopathy.  Neurological: He is oriented to person, place, and time.  Skin: Skin is warm and dry. No rash noted.  He is not diaphoretic. No erythema. No pallor.  Psychiatric: He has a normal mood and affect. His behavior is normal. Judgment and thought content normal.  Vitals reviewed.    Lab Results  Component Value Date   WBC 6.9 10/09/2012   HGB 12.4* 10/09/2012   HCT 37.7* 10/09/2012   PLT 90.0* 10/09/2012   GLUCOSE 93 04/03/2013   CHOL 137 10/09/2012   TRIG 89.0 10/09/2012   HDL 34.40* 10/09/2012   LDLCALC 85 10/09/2012   ALT 15 10/09/2012   AST 18 10/09/2012   NA 138 04/03/2013   K 4.0 04/03/2013   CL 106 04/03/2013   CREATININE 1.1 04/03/2013   BUN 14 04/03/2013   CO2 24 04/03/2013   TSH 2.00 10/09/2012   PSA 0.19 10/09/2012   HGBA1C 5.1 10/09/2012       Assessment & Plan:

## 2014-04-18 NOTE — Progress Notes (Signed)
Pre visit review using our clinic review tool, if applicable. No additional management support is needed unless otherwise documented below in the visit note. 

## 2014-04-19 ENCOUNTER — Encounter: Payer: Self-pay | Admitting: Internal Medicine

## 2014-04-19 LAB — CARDIAC PANEL
CK-MB: 2.2 ng/mL (ref 0.3–4.0)
Relative Index: 1.7 calc (ref 0.0–2.5)
Total CK: 128 U/L (ref 7–232)

## 2014-04-19 LAB — D-DIMER, QUANTITATIVE: D-Dimer, Quant: 0.59 ug/mL-FEU — ABNORMAL HIGH (ref 0.00–0.48)

## 2014-04-19 LAB — TSH: TSH: 4.59 u[IU]/mL — AB (ref 0.35–4.50)

## 2014-04-19 NOTE — Assessment & Plan Note (Signed)
I will check his A1C to see if he has developed DM2 

## 2014-04-19 NOTE — Assessment & Plan Note (Signed)
This is new onset, he has significant risk factors for CVA so will start anticoagulation with xarelto His HR is on the 50's so I have asked him to stop the beta-blocker Will check his labs today to look for secondary causes of A fib Refer to cardiology for further evaluation

## 2014-04-19 NOTE — Assessment & Plan Note (Signed)
This is multifactorial - OSA, obesity and deconditioning are a big factor but he also has new onset A fib with a HR in the 50's Will start the beta-blocker Will check labs to look for other causes of DOE such as CHF, PE, thryoid disease

## 2014-04-19 NOTE — Assessment & Plan Note (Signed)
He has had some trouble with the function of his CPAP device so I have asked him to f/up with sleep medicine

## 2014-04-19 NOTE — Assessment & Plan Note (Signed)
His BP is well controlled I will check his lytes to see if there is a concern for a recurrence of this

## 2014-04-24 ENCOUNTER — Other Ambulatory Visit: Payer: Self-pay | Admitting: *Deleted

## 2014-04-24 ENCOUNTER — Encounter: Payer: Self-pay | Admitting: Internal Medicine

## 2014-04-24 MED ORDER — AMLODIPINE BESYLATE 10 MG PO TABS: 10.0000 mg | ORAL_TABLET | Freq: Every day | ORAL | Status: DC

## 2014-04-24 MED ORDER — FLUOXETINE HCL 10 MG PO CAPS: 10.0000 mg | ORAL_CAPSULE | Freq: Every day | ORAL | Status: DC

## 2014-04-24 NOTE — Telephone Encounter (Signed)
Left msg on triage needing his fluoxetine & amlodipine sent to [facility]. Sent to mail service...Raechel Chute/lmb

## 2014-05-08 ENCOUNTER — Ambulatory Visit: Payer: 59 | Admitting: Cardiology

## 2014-05-16 ENCOUNTER — Ambulatory Visit: Payer: 59 | Admitting: Internal Medicine

## 2014-05-22 ENCOUNTER — Ambulatory Visit: Payer: 59 | Admitting: Cardiology

## 2014-06-25 ENCOUNTER — Ambulatory Visit: Payer: 59 | Admitting: Cardiology

## 2014-07-25 ENCOUNTER — Telehealth: Payer: Self-pay | Admitting: Internal Medicine

## 2014-07-25 NOTE — Telephone Encounter (Signed)
Patient requesting refill for amLODipine (NORVASC) 10 MG tablet [[id number]. [facility] [phone] [place] [phone]. Would like a 90 day supply.

## 2014-07-26 MED ORDER — AMLODIPINE BESYLATE 10 MG PO TABS: 10.0000 mg | ORAL_TABLET | Freq: Every day | ORAL | Status: DC

## 2014-07-26 NOTE — Telephone Encounter (Signed)
Done

## 2014-11-26 ENCOUNTER — Other Ambulatory Visit: Payer: Self-pay | Admitting: Internal Medicine

## 2015-02-27 ENCOUNTER — Telehealth: Payer: Self-pay | Admitting: *Deleted

## 2015-02-27 MED ORDER — FLUOXETINE HCL 10 MG PO CAPS: ORAL_CAPSULE | ORAL | Status: DC

## 2015-02-27 MED ORDER — AMLODIPINE BESYLATE 10 MG PO TABS: 10.0000 mg | ORAL_TABLET | Freq: Every day | ORAL | Status: DC

## 2015-02-27 NOTE — Telephone Encounter (Signed)
Receive call [place] to get [place] medication sent [place] new pharmacy [facility] [place]. Inform sending electronically...Raechel Chute/lmb

## 2015-07-10 ENCOUNTER — Telehealth: Payer: Self-pay | Admitting: Internal Medicine

## 2015-07-10 MED ORDER — AMLODIPINE BESYLATE 10 MG PO TABS: 10.0000 mg | ORAL_TABLET | Freq: Every day | ORAL | Status: AC

## 2015-07-10 NOTE — Telephone Encounter (Signed)
Pt request refill for amLODipine (NORVASC) 10 MG tablet to be send to [facility]. Offer an appt but pt state [place] and do not have the transportation right now (still want to be under Dr. Barnett ApplebaumJones's care).Please help

## 2015-09-15 ENCOUNTER — Other Ambulatory Visit: Payer: Self-pay | Admitting: *Deleted

## 2015-09-15 MED ORDER — FLUOXETINE HCL 10 MG PO CAPS: ORAL_CAPSULE | ORAL | Status: AC

## 2015-11-03 ENCOUNTER — Other Ambulatory Visit: Payer: Self-pay | Admitting: Internal Medicine

## 2015-11-04 NOTE — Telephone Encounter (Signed)
Sister called about this and states pt is living in homeless shelter. He has no ride and will not be able to make it to an appointment but still wants to be able to take his medication. Last visit was Dr Yetta BarreJones was 03/2014. Please advise thanks.

## 2015-11-05 NOTE — Telephone Encounter (Signed)
Per verbal instruction PCP is not able to rx bp meds without physically seeing the patient.   Contacted pt sister and instructed of the same. Burnis KingfisherUrsela stated understanding and will get that message to brother.
